# Patient Record
Sex: Female | Born: 1955 | Race: Black or African American | Hispanic: No | Marital: Married | Smoking: Never smoker
Health system: Southern US, Community
[De-identification: ages and names within clinical notes are randomized; demographics above are authoritative.]

## PROBLEM LIST (undated history)

## (undated) DIAGNOSIS — K219 Gastro-esophageal reflux disease without esophagitis: Secondary | ICD-10-CM

## (undated) DIAGNOSIS — G459 Transient cerebral ischemic attack, unspecified: Secondary | ICD-10-CM

## (undated) DIAGNOSIS — G4700 Insomnia, unspecified: Secondary | ICD-10-CM

## (undated) DIAGNOSIS — I639 Cerebral infarction, unspecified: Secondary | ICD-10-CM

## (undated) DIAGNOSIS — Z460 Encounter for fitting and adjustment of spectacles and contact lenses: Secondary | ICD-10-CM

## (undated) DIAGNOSIS — I10 Essential (primary) hypertension: Secondary | ICD-10-CM

## (undated) DIAGNOSIS — G4730 Sleep apnea, unspecified: Secondary | ICD-10-CM

## (undated) DIAGNOSIS — G8929 Other chronic pain: Secondary | ICD-10-CM

## (undated) DIAGNOSIS — E039 Hypothyroidism, unspecified: Secondary | ICD-10-CM

## (undated) DIAGNOSIS — J45909 Unspecified asthma, uncomplicated: Secondary | ICD-10-CM

## (undated) DIAGNOSIS — J302 Other seasonal allergic rhinitis: Secondary | ICD-10-CM

## (undated) DIAGNOSIS — M1990 Unspecified osteoarthritis, unspecified site: Secondary | ICD-10-CM

## (undated) DIAGNOSIS — Z972 Presence of dental prosthetic device (complete) (partial): Secondary | ICD-10-CM

## (undated) HISTORY — PX: CHOLECYSTECTOMY: SHX55

## (undated) HISTORY — PX: TONSILLECTOMY: SUR1361

## (undated) HISTORY — PX: ABDOMINAL HYSTERECTOMY: SHX81

---

## 2007-04-09 ENCOUNTER — Ambulatory Visit (HOSPITAL_BASED_OUTPATIENT_CLINIC_OR_DEPARTMENT_OTHER): Admission: RE | Admit: 2007-04-09 | Discharge: 2007-04-09 | Payer: Self-pay | Admitting: Orthopedic Surgery

## 2007-04-09 HISTORY — PX: KNEE ARTHROSCOPY: SHX127

## 2008-05-11 ENCOUNTER — Inpatient Hospital Stay (HOSPITAL_COMMUNITY): Admission: RE | Admit: 2008-05-11 | Discharge: 2008-05-14 | Payer: Self-pay | Admitting: Orthopedic Surgery

## 2008-05-11 HISTORY — PX: TOTAL KNEE ARTHROPLASTY: SHX125

## 2010-05-23 LAB — CBC
HCT: 30.8 % — ABNORMAL LOW (ref 36.0–46.0)
Hemoglobin: 10.6 g/dL — ABNORMAL LOW (ref 12.0–15.0)
Hemoglobin: 11.1 g/dL — ABNORMAL LOW (ref 12.0–15.0)
Hemoglobin: 11.1 g/dL — ABNORMAL LOW (ref 12.0–15.0)
MCHC: 34.4 g/dL (ref 30.0–36.0)
Platelets: 153 10*3/uL (ref 150–400)
RBC: 3.7 MIL/uL — ABNORMAL LOW (ref 3.87–5.11)
RBC: 3.85 MIL/uL — ABNORMAL LOW (ref 3.87–5.11)
RBC: 3.88 MIL/uL (ref 3.87–5.11)
WBC: 7.4 10*3/uL (ref 4.0–10.5)
WBC: 7.5 10*3/uL (ref 4.0–10.5)

## 2010-05-23 LAB — BASIC METABOLIC PANEL
BUN: 6 mg/dL (ref 6–23)
CO2: 32 mEq/L (ref 19–32)
Calcium: 8.5 mg/dL (ref 8.4–10.5)
Calcium: 9.1 mg/dL (ref 8.4–10.5)
Creatinine, Ser: 0.77 mg/dL (ref 0.4–1.2)
GFR calc Af Amer: >60 mL/min (ref >60)
GFR calc Af Amer: >60 mL/min (ref >60)
GFR calc Af Amer: >60 mL/min (ref >60)
GFR calc non Af Amer: >60 mL/min (ref >60)
GFR calc non Af Amer: >60 mL/min (ref >60)
Potassium: 4 mEq/L (ref 3.5–5.1)
Potassium: 4 mEq/L (ref 3.5–5.1)
Sodium: 132 mEq/L — ABNORMAL LOW (ref 135–145)
Sodium: 141 mEq/L (ref 135–145)
Sodium: 143 mEq/L (ref 135–145)

## 2010-05-23 LAB — PROTIME-INR
INR: 1.1 (ref 0.00–1.49)
INR: 1.3 (ref 0.00–1.49)
Prothrombin Time: 14.9 seconds (ref 11.6–15.2)

## 2010-05-24 LAB — COMPREHENSIVE METABOLIC PANEL
Alkaline Phosphatase: 67 U/L (ref 39–117)
BUN: 12 mg/dL (ref 6–23)
Chloride: 100 mEq/L (ref 96–112)
Glucose, Bld: 93 mg/dL (ref 70–99)
Potassium: 3.6 mEq/L (ref 3.5–5.1)
Total Bilirubin: 0.4 mg/dL (ref 0.3–1.2)

## 2010-05-24 LAB — CBC
HCT: 41 % (ref 36.0–46.0)
Hemoglobin: 13.8 g/dL (ref 12.0–15.0)
WBC: 7.4 10*3/uL (ref 4.0–10.5)

## 2010-05-24 LAB — PROTIME-INR
INR: 1 (ref 0.00–1.49)
Prothrombin Time: 12.8 seconds (ref 11.6–15.2)

## 2010-05-24 LAB — URINALYSIS, ROUTINE W REFLEX MICROSCOPIC
Bilirubin Urine: NEGATIVE
Hgb urine dipstick: NEGATIVE
Protein, ur: NEGATIVE mg/dL
Urobilinogen, UA: 0.2 mg/dL (ref 0.0–1.0)

## 2010-05-24 LAB — TYPE AND SCREEN: Antibody Screen: NEGATIVE

## 2010-06-03 IMAGING — CR DG KNEE 1-2V PORT*L*
2 series · 2 of 2 positions shown · non-contrast
Comparison: None available.

CLINICAL DATA: Knee replacement.

PORTABLE LEFT KNEE - 1-2 VIEW

[view not recorded (1 of 2)]
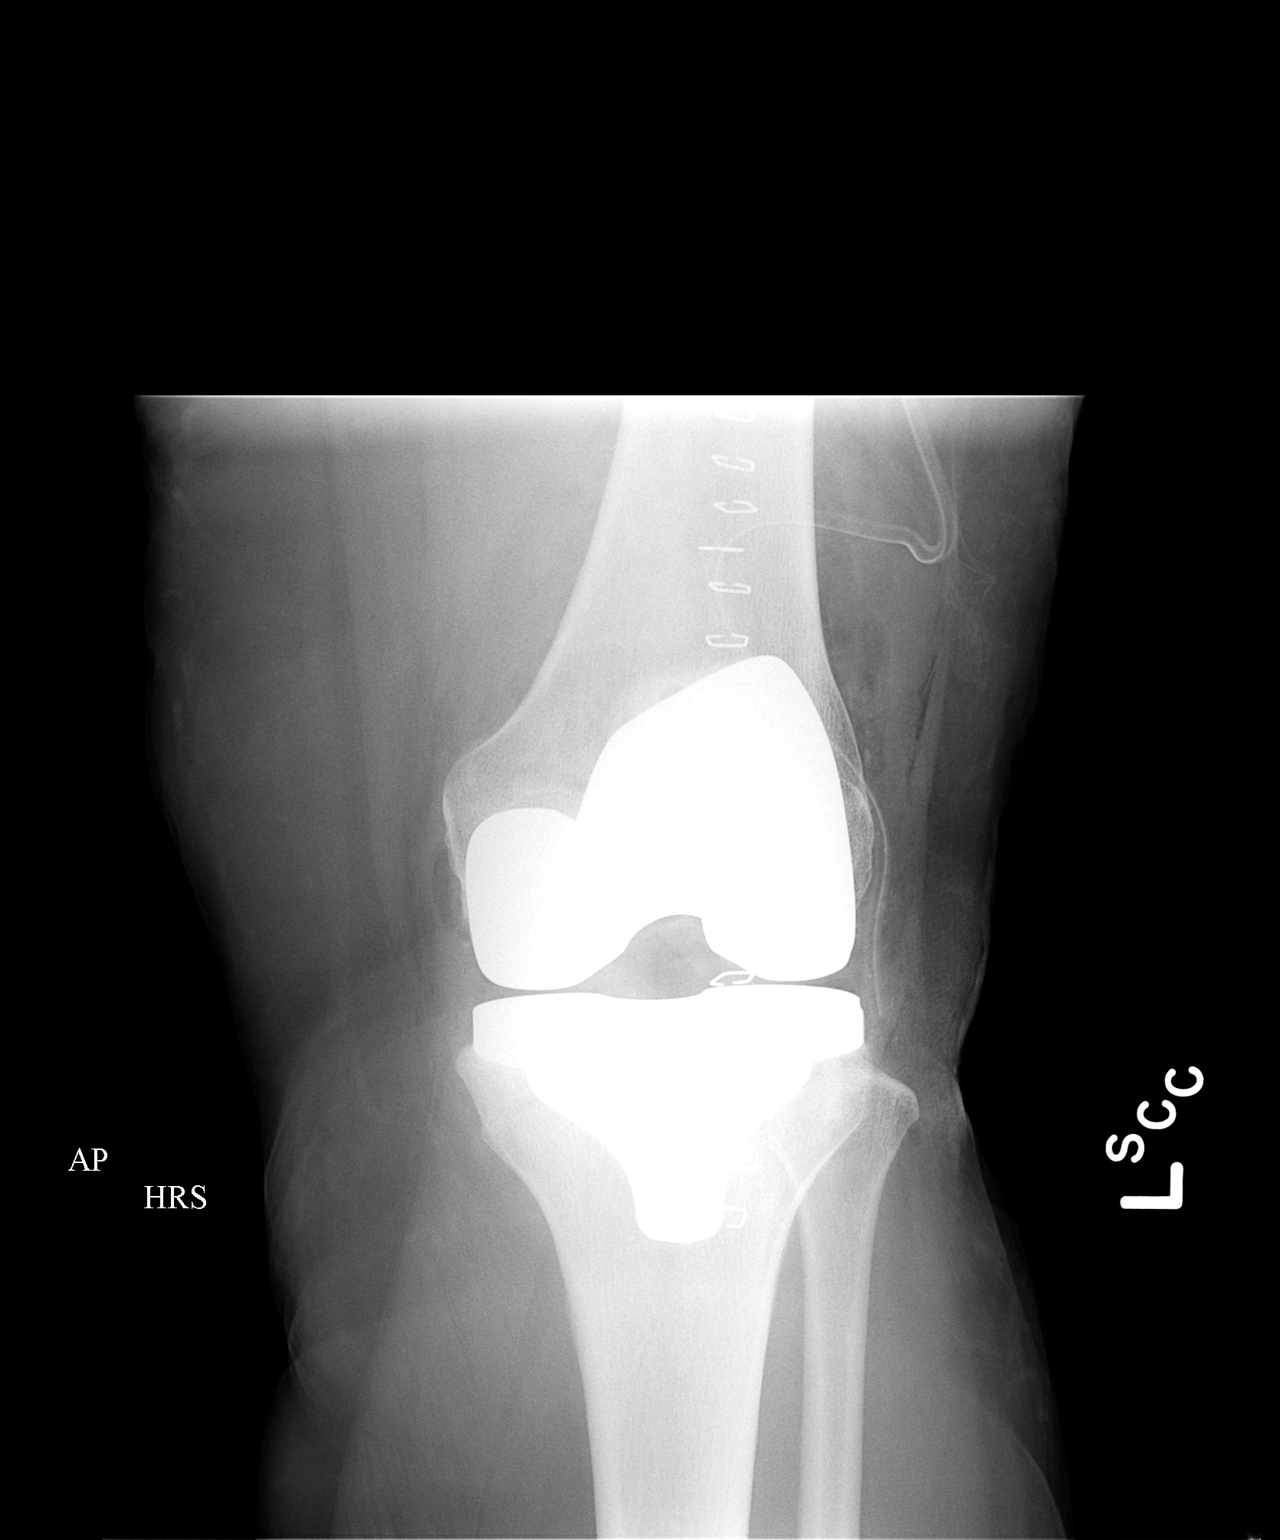

[view not recorded (2 of 2)]
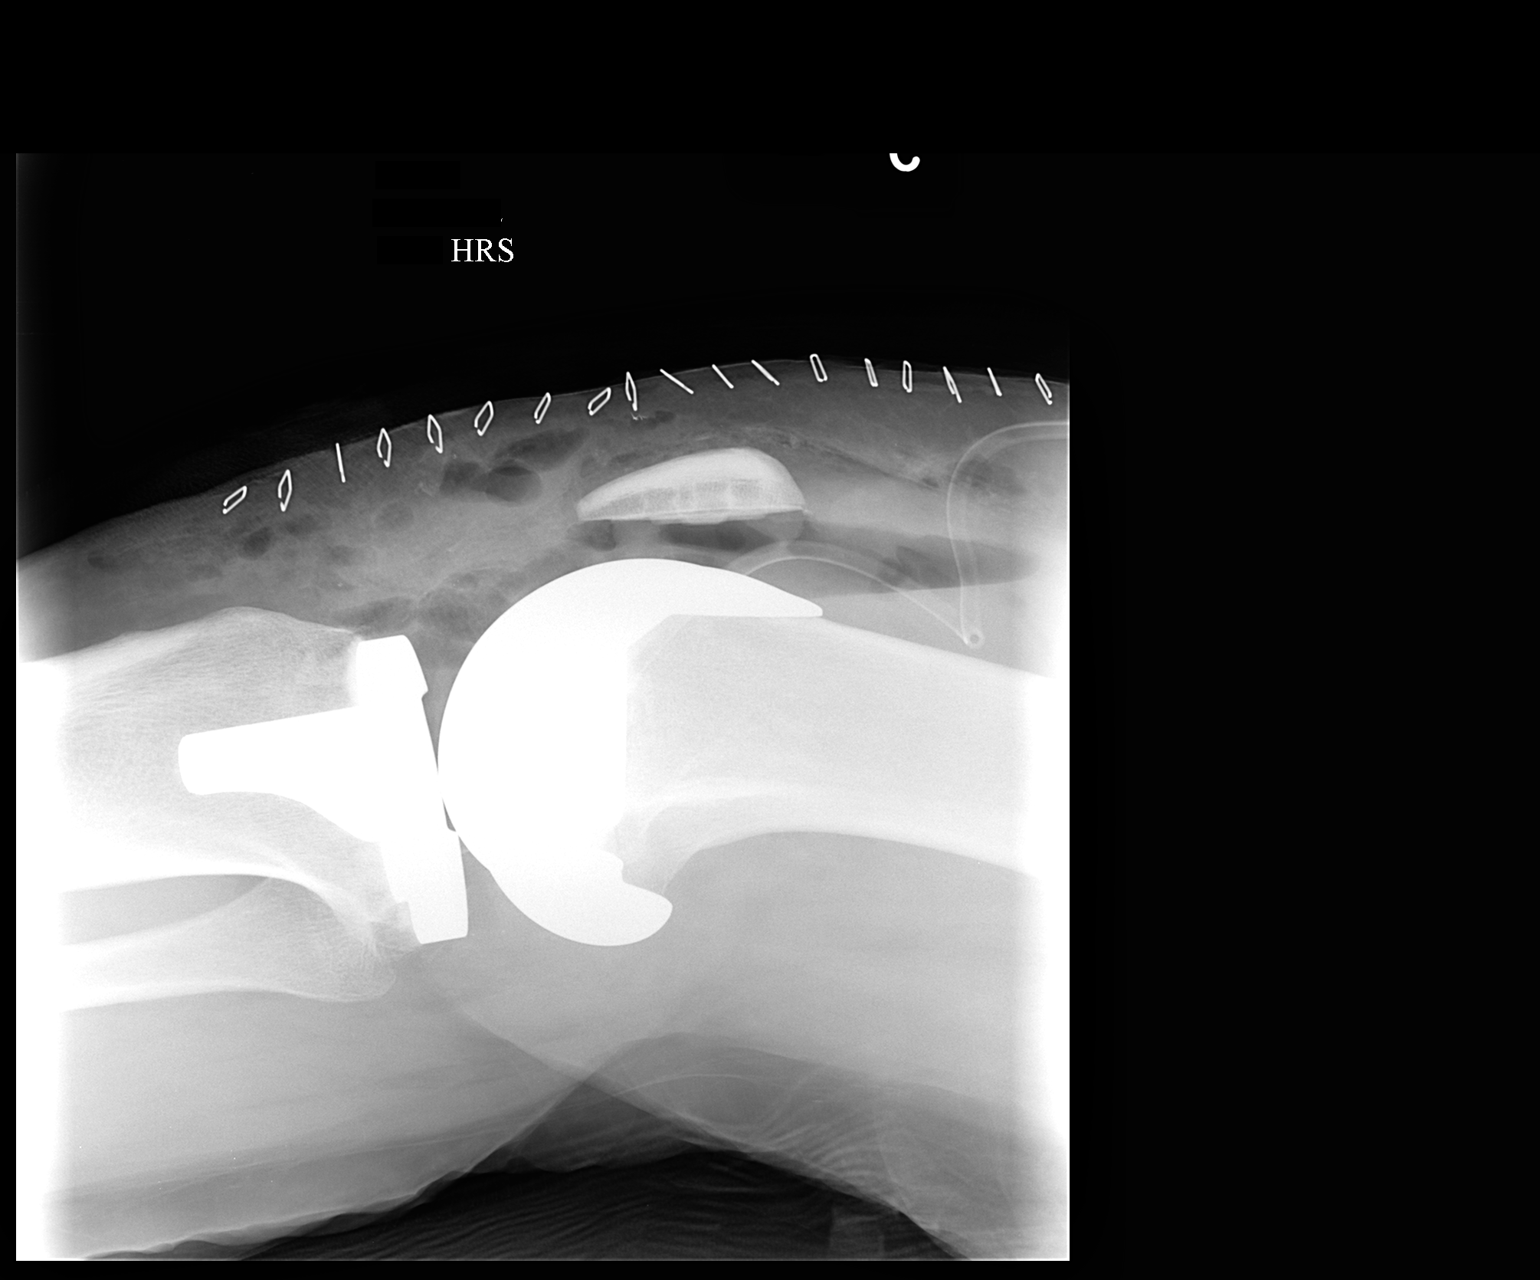

[2 of 2 positions shown; findings below may reference images not displayed]

FINDINGS: The patient has a left total knee arthroplasty.  Surgical
staples and drain are noted.  There is gas in the joint with a
joint effusion.  No fracture is present and the device is located.
IMPRESSION: Left total knee replaced without evidence of complication.

## 2010-06-26 NOTE — Op Note (Signed)
Mary Harvey, Mary Harvey              ACCOUNT NO.:  [id number]   MEDICAL RECORD NO.:  [id number]          PATIENT TYPE:  INP   LOCATION:  5007                         FACILITY:  [facility]   PHYSICIAN:  Loreta Ave, M.D. DATE OF BIRTH:  [birth year]   DATE OF PROCEDURE:  05/11/2008  DATE OF DISCHARGE:                               OPERATIVE REPORT   PREOPERATIVE DIAGNOSIS:  End-stage degenerative arthritis, left knee,  varus alignment.   POSTOPERATIVE DIAGNOSIS:  End-stage degenerative arthritis, left knee,  varus alignment.   PROCEDURE:  Left total knee replacement.  Modified minimally invasive  approach.  Soft tissue balancing.  Cemented pegged posterior stabilized  Triathlon Stryker femoral component.  Cemented #4 tibial component with  11-mm polyethylene insert.  Resurfacing patella.  cemented and pegged  medial offset 32-mm component.   SURGEON:  Loreta Ave, MD   ASSISTANT:  Genene Churn. Barry Dienes, [place], present throughout the entire case and  necessary for timely completion of procedure.   ANESTHESIA:  General.   BLOOD LOSS:  Minimal.   SPECIMEN:  None.   CULTURES:  None.   COMPLICATIONS:  None.   DRESSINGS:  Soft compressive knee immobilizer.   DRAINS:  Hemovac x1.   TOURNIQUET TIME:  1 hour 15 minutes.   PROCEDURE:  The patient was brought to the operating room and placed on  operating table in supine position.  After an adequate anesthesia had  been obtained, knee examined.  Full extension, good flexion, varus  alignment correctable to neutral.  Tourniquet applied.  Prepped and  draped in usual sterile fashion.  Exsanguinated with elevation and  Esmarch.  Tourniquet inflated to 350 mmHg.  Straight incision above the  patella down to tibial tubercle.  Medial arthrotomy, vastus splitting,  preserving quad tendon for a modified minimally invasive approach.  Knee  exposed.  Remnants of menisci, cruciate ligaments, loose body, and spurs  removed.  Grade 4 changes most  marked medially.  Distal femoral  resection.  Intramedullary guide set at 5 degrees of valgus resecting 10  mm.  Using epicondylar axis, sized, cut, and fitted for #4 component.  Tibia exposed.  Extramedullary guide.  Posterior slope cut 3 degrees  going below the defect medially.  Sized to #4 component.  All recess  examined.  All debris removed.  Patella exposed.  Measured, cut,  drilled, and fitted for 32-mm component.  Trial put in place throughout  #4 above and #4 below.  With 11-mm insert and 32-mm patella, I had full  extension, full flexion, good alignment, good stability, and good  patellofemoral tracking.  Tibia was marked for rotation and hand reamed.  All trials removed.  Copious irrigation with a pulse irrigating device.  Cement prepared, placed on all components, and firmly seated.  Excess of  cement removed.  Polyethylene attached to tibia.  Knee reduced.  Patellar clamp put in place.  Once cement hardened, it was reexamined.  Excellent stability, alignment, flexion/extension, and tracking.  Hemovac placed and brought out through a separate stab wound.  Knee once  again irrigated.  Arthrotomy closed with #1 Vicryl.  Skin  and  subcutaneous tissue with Vicryl and staples.  Knee injected with  Marcaine.  Hemovac clamped.  Sterile compressive dressing applied.  Tourniquet deflated and removed.  Knee immobilizer applied.  Anesthesia  reversed.  Brought to the room.  Tolerated the surgery well.  No  complications.      Loreta Ave, M.D.  Electronically Signed     DFM/MEDQ  D:  05/11/2008  T:  05/12/2008  Job:  [id number]

## 2010-06-26 NOTE — Op Note (Signed)
Mary Harvey, Mary Harvey              ACCOUNT NO.:  [id number]   MEDICAL RECORD NO.:  [id number]          PATIENT TYPE:  AMB   LOCATION:  DSC                          FACILITY:  [facility]   PHYSICIAN:  Loreta Ave, M.D. DATE OF BIRTH:  December 20, 1955   DATE OF PROCEDURE:  04/09/2007  DATE OF DISCHARGE:                               OPERATIVE REPORT   PREOPERATIVE DIAGNOSES:  1. Left knee chondromalacia of patella.  2. Medial meniscus tear.   POSTOPERATIVE DIAGNOSES:  1. 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  2. Extensive tear in posterior half of the medial meniscus with flap      tears in the front and back of the lateral meniscus.   OPERATION PERFORMED:  1. Left knee examination under anesthesia, arthroscopy.  2. Debridement of the medial and lateral meniscus.  3. Chondroplasty of the patella and medial femoral condyle.  4. Removal of chondral loose bodies and hypertrophic synovitis.   SURGEON:  Loreta Ave, M.D.   ASSISTANT:  Genene Churn. Owens, P. A.   ANESTHESIA:  General.   ESTIMATED BLOOD LOSS:  The blood loss was minimal.   TOURNIQUET TIME:  A tourniquet was not employed.   SPECIMENS:  None.   CULTURES:  None.   COMPLICATIONS:  None.   DRESSINGS:  Soft compressive.   DESCRIPTION OF THE OPERATION:  The patient was brought to the operating  room and was placed on the operating table in the supine position.  After adequate anesthesia had been obtained the left knee was examined.  The patella alta had some lateral tracking, but no tethering.  There  were stable ligaments.  Tourniquet leg holder was applied.  The leg was  prepped and draped in the usual sterile fashion.  Three portals were  created; one superolateral and one each medially and laterally in the  parapatellar.  The inflow catheter was introduced.  The knee was  distended.  The arthroscope was introduced.   The knee was inspected.  Extensive grade 3 changes on the  patella were  debrided.  The patella alta had lateral tracking, but no tethering.  The  trochlea looked good.  There were chondral loose bodies throughout and  hypertrophic synovitis throughout, which were all debrided.  The ACL had  some myxoid degeneration, but was still intact and functional.  The  medial compartment showed complete detachment of posterior and medial  meniscus, and the entire posterior half unstable and displaced out.  The  posterior half was removed, tapered the remaining meniscus.  The tibia  looked good, but on the femur about halfway __________  met a deep grade  3 lesion.  Chondroplasty was done to a stable surface throughout.  All  loose fragments were removed.  Laterally there were five tears in the  front and back of the lateral meniscus, and some small radial tears in  the middle.  These were saucerized out and tapered in smoothly.  The  lateral component otherwise looked good.   The entire knee was examined and no other findings were  appreciated.   The instruments and fluid were removed.  The portals in the knee were  injected with Marcaine.  The portals were closed with 4-0 nylon.  A  sterile compressive dressing was applied.   Anesthesia was reversed.  The patient was taken to the recovery room.  The patient tolerated surgery well with no complications.      Loreta Ave, M.D.  Electronically Signed     DFM/MEDQ  D:  04/09/2007  T:  04/10/2007  Job:  [id number]

## 2010-06-29 NOTE — Discharge Summary (Signed)
NAMERUTHY, Mary Harvey              ACCOUNT NO.:  [id number]   MEDICAL RECORD NO.:  [id number]          PATIENT TYPE:  INP   LOCATION:  5007                         FACILITY:  [facility]   PHYSICIAN:  Loreta Ave, M.D. DATE OF BIRTH:  07-15-1955   DATE OF ADMISSION:  05/11/2008  DATE OF DISCHARGE:  05/14/2008                               DISCHARGE SUMMARY   FINAL DIAGNOSES:  1. Status post left total knee replacement for end-stage degenerative      joint disease.  2. Hypertension.  3. Asthma.   HISTORY OF PRESENT ILLNESS:  A 55 year old black female with history of  end-stage DJD and left knee chronic pain, presented to our office for  preop evaluation for a total knee replacement.  She had progressively  worsening pain with failure to response with conservative treatment.  Significant decrease in her daily activities due to the ongoing  complaint.   HOSPITAL COURSE:  On May 11, 2008, the patient was taken to the [facility] OR, and a left total knee replacement procedure performed.   SURGEON:  Loreta Ave, MD   ASSISTANT:  Genene Churn. Barry Dienes, PA-C   ANESTHESIA:  General.   SPECIMENS:  None.   ESTIMATED BLOOD LOSS:  Minimal.   TOURNIQUET TIME:  90 minutes.   DRAINS:  One Hemovac drain placed.   COMPLICATIONS:  There were no surgical or anesthesia complications, and  the patient was transferred to recovery in stable condition.   On May 12, 2008, the patient complained of nausea.  Pain control.  Vital signs stable, afebrile.  Hemoglobin 11.1.  INR 1.1.  Discontinued  morphine PCA.  Percocet changed to Norco.  Discontinued Foley.  PT/OT  consults.  Pharmacy protocol Coumadin started.  On May 13, 2008, the  patient doing well.  She is progressing with therapy.  Vital signs are  stable, afebrile.  Wound looks good and staples intact.  There are no  drainage or signs of infection.  Nausea improved.  May 14, 2008, the  patient doing well with good pain control.  No  bowel movement yet.  Vital signs stable, afebrile.  Hemoglobin 11.1.  INR 1.3.  Electrolytes  stable.  Wound looks good.  Minimal serous drainage from drain portal.  No infection.  Calf nontender.  Neurovascularly intact.  She has  progressed very well with therapy.  She is stable to discharge home  after she has a bowel movement.   CONDITION:  Good and stable.   DISPOSITION:  Discharge home.   MEDICATIONS:  1. Norco 7.5/325 one or two tabs p.o. q.4-6 h. p.r.n. for pain.  2. Robaxin 500 mg 1 tablet p.o. q.6 h. p.r.n. for spasms.  3. Coumadin pharmacy protocol.  4. Resume previous home meds.   INSTRUCTIONS:  The patient will work with home health PT and OT to  improve ambulation and knee range of motion and strengthening.  Daily  dressing changes with 4x4 gauze and tape.  She will follow up in office  when she is 2 weeks postop for recheck.  Return sooner if needed.  Genene Churn. Denton Meek.      Loreta Ave, M.D.  Electronically Signed    JMO/MEDQ  D:  06/22/2008  T:  06/22/2008  Job:  [id number]

## 2010-11-02 LAB — POCT I-STAT, CHEM 8
BUN: 12
Calcium, Ion: 1.06 — ABNORMAL LOW
Chloride: 100
Creatinine, Ser: 0.8
Glucose, Bld: 94
HCT: 44
Hemoglobin: 15
Potassium: 3.3 — ABNORMAL LOW
Sodium: 136
TCO2: 29

## 2011-05-07 ENCOUNTER — Other Ambulatory Visit: Payer: Self-pay | Admitting: Orthopedic Surgery

## 2011-05-07 DIAGNOSIS — R937 Abnormal findings on diagnostic imaging of other parts of musculoskeletal system: Secondary | ICD-10-CM

## 2011-05-07 DIAGNOSIS — M519 Unspecified thoracic, thoracolumbar and lumbosacral intervertebral disc disorder: Secondary | ICD-10-CM

## 2011-05-09 ENCOUNTER — Other Ambulatory Visit: Payer: Self-pay

## 2011-05-10 ENCOUNTER — Ambulatory Visit
Admission: RE | Admit: 2011-05-10 | Discharge: 2011-05-10 | Disposition: A | Discharge status: Home / Self Care | Payer: BC Managed Care – PPO | Source: Ambulatory Visit | Attending: Orthopedic Surgery | Admitting: Orthopedic Surgery

## 2011-05-10 DIAGNOSIS — M519 Unspecified thoracic, thoracolumbar and lumbosacral intervertebral disc disorder: Secondary | ICD-10-CM

## 2011-05-10 DIAGNOSIS — R937 Abnormal findings on diagnostic imaging of other parts of musculoskeletal system: Secondary | ICD-10-CM

## 2011-07-22 ENCOUNTER — Encounter (HOSPITAL_BASED_OUTPATIENT_CLINIC_OR_DEPARTMENT_OTHER): Payer: Self-pay | Admitting: *Deleted

## 2011-07-23 ENCOUNTER — Encounter (HOSPITAL_BASED_OUTPATIENT_CLINIC_OR_DEPARTMENT_OTHER): Payer: Self-pay | Admitting: *Deleted

## 2011-07-23 NOTE — Progress Notes (Signed)
Have cardiac notes and ekg Bring all meds,overnight bag, Will need [address]

## 2011-07-24 NOTE — H&P (Signed)
[facility]   A Division of [facility]   [address] [place], [place] [address] Telephone: [phone]  Fax: [phone]     PATIENT: Mary Harvey, Mary Harvey   MR#: [id number]  Visit Date: 05/21/2011     HISTORY OF PRESENT ILLNESS:  The patient is referred by Dr. Eulah Pont and Zonia Kief, PA-C for evaluation of neck and low back pain.  She has had ongoing symptoms since 2005.  She had problems with her knees.  She had total knee arthroplasty.  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     The patient states that she has pain that wakes in the morning.  She has more pain going down her left arm than right.  It radiates down to her fingers primarily along the radial aspect.  She had a MRI scan which showed spondylitic changes most prominent on the right at C5-6 corresponding with her symptoms, less significant changes at C4-5 and then C6-7 even less remarkable.  There is tiny disk protrusions at C2-3 and C3-4 as well, but they are not causing any compression.  She has had some injections in her shoulders, has had bone-on-bone changes on 1 shoulder, has bursitis in the other shoulder.  The lumbar spine continues to give her problems not as severe as the neck.  The pain radiates from her back down into her leg and L5-S1 shows posterior disk bulge with protrusion to the right of midline causing narrowing of the right neural foramina.  This MRI was in July of 2012.  Cervical MRI was recent 05/03/2011.  The patient is having her thyroid cyst monitored with yearly ultrasound.    CURRENT MEDICATIONS:  She has hypertension medicines, thyroid medication which she has been on for 1-1/2 years.   PAST MEDICAL/SURGICAL HISTORY:  She has had a previous left total knee arthroplasty by Dr. Eulah Pont in 2010.     SOCIAL HISTORY:  The patient is married to her husband, Mary Harvey.  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  She has to do a lot of lifting, standing.  She has a very active job.     FAMILY HISTORY:  Positive for diabetes, heart disease, hypertension.   REVIEW OF SYSTEMS:  Positive for hypertension and thyroid condition.   PHYSICAL EXAMINATION:  She is alert and oriented.  She has bilateral brachial plexus tenderness.  She has some discomfort with cervical flexion.  She can flex chin to chest.     She has intact upper extremity reflexes.  Positive impingement on the right.  Left shoulder she has pain with internal/external rotation.  Limited range of motion with abduction with crepitus.   She also has some significant tenderness of trochanteric bursa and has some secondary trochanteric bursitis. There is sciatic notch tenderness.  Negative straight leg raise to 90 degrees.  EHL, anterior tib is strong.  MRI scan shows some disk protrusion at the 5-1 level.   RADIOGRAPHS/TEST:  C-spine MRI scan is reviewed with the patient.   PLAN:  We will set her up for some physical therapy for the cervical spine.  Check her back in 6 weeks.  I discussed with her in detail, she has a couple of problems that are related, some of the cervical spine giving her problems at C4-5 and C5-6.  These appear to be the worst levels.  We discussed operative intervention for these levels.  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  She understands that some of her shoulder pain caused by shoulder pathology would not be improved after she had a cervical procedure to take care of the disk problems that she has.  Hopefully she can get some improvement with physical therapy.  I recommend checking a BMET and uric acid; her symptoms are not classic for gout, certainly would be unusual, however, with significant osteoarthritis of her left shoulder, also the knee problems, she might have some crystal arthropathy unrecognized.  We will  call her with the results and I will check her back in 6-7 weeks after some therapy at [facility].   For additional information please see handwritten notes, reports, orders and prescriptions in this chart.      Mark C. Ophelia Charter, M.D.    Auto-Authenticated by Veverly Fells. Ophelia Charter, M.D.  MCY/cd/af DD: 05/21/2011  DT: 05/22/2011   cc: Loreta Ave, MD(Emdat [employer])   Genene Churn. Barry Dienes, [facility]    [facility]   A Division of [facility]   [address], [place], [place] [address] Telephone: [phone]  Fax: [phone]     PATIENT: Mary Harvey, Mary Harvey   MR#: [id number]  Visit Date: 07/02/2011     The patient returns.  She states she is having persistent problems with neck pain, shoulder pain, pain that radiates down her arm.  It is worse on the left arm than the right arm.  Her symptoms have been persistent.  She has been through physical therapy for 4-6 weeks with persistent symptoms.     No change in her hypertension, thyroid medications.  She had a left total knee arthroplasty.  She has known osteoarthritis of both shoulders.  No change in family history, social history, these all updated from 05/21/2011 and are unchanged.   PHYSICAL EXAMINATION:  The patient is 5 feet 4 inches, 195 pounds.  Alert and oriented, WD, WN.  No rash over exposed skin.  No supraclavicular lymphadenopathy.  Bilateral brachial plexus tenderness worse on the left than right.  Positive Spurling on the left, negative Lhermitte.  Shoulder range of motion shows full range of motion with discomfort.  She has pain with extreme range of motion of her left shoulder.  Positive impingement on the left.  Upper extremity reflexes are 1+ and symmetrical.  Long head of the biceps is tender.  Impingement positive on the left.  She had a previous MRI of her shoulder which was done in [place], [place].     The patient has normal gait, has persistent tenderness lumbar spine.  EHL, anterior  tib is intact.  No distal edema and she has intact pedal pulses.  No venous stasis changes.   ASSESSMENT:  Cervical spondylosis most significant C4-5, C5-6.    PLAN:  We will set her up for 1 epidural cervical injection.  She has some osteoarthritis of her shoulder with some impingement and we will see her back after the epidural.  Robaxin was renewed, this seems to be helping her, she will call if she needs a refill later.  Office followup after epidural then we can make a decision about cervical spine 2-level fusion.   For additional information please see handwritten notes, reports, orders and prescriptions in this chart.      Mark C. Ophelia Charter, M.D.    Auto-Authenticated by Veverly Fells. Ophelia Charter, M.D.  MCY/cd DD: 07/02/2011  DT: 07/03/2011   [phone]  [phone] A Division of [facility]  Loreta Ave, M.D.     Robert A. Thurston Hole, M.D.     Lunette Stands, M.D. Eulas Post, M.D.    Buford Dresser, M.D. Estell Harpin, M.D. Ralene Cork, D.O.          Genene Churn. Barry Dienes, PA-C            Kirstin A. Shepperson, PA-C Archie, OPA-C   RE: Jamylah, Marinaccio                                [phone]      DOB: 1956-02-05 PROGRESS NOTE: 07-16-11 Mary Harvey comes in for follow up.  Biggest issue is her left shoulder.  Well documented arthritis.  Getting worse rather than better.  We have exhausted conservative therapy.  X-rays showing extreme degenerative changes approaching bone on bone.  MRI scan of her shoulder done on April 10, 2010 showed considerable severe degenerative changes throughout the shoulder.  An un-united os acromiale Type III.  Some mild impingement.  Rotator cuff intact.  Injection and treatment both subacromial and intraarticular, only transiently helpful, nothing dramatic in the way of improvement.  Recent workup of cervical spine and conservative treatment has been helpful to an extent and I wanted to be sure that wasn't causing an overlay of symptoms down the  right shoulder.  That treatment and evaluation completed by Dr. Ophelia Charter.  Although some improvement with that, she now really has given this a lot of thought and it is the shoulder that is her biggest problem.  Rest pain and night pain.  Marked functional impact.  Losing motion.  Wakes her up at night.  Still has some soreness in the cervical spine.  Occasional numbness and tingling down into her hand, but the global deep seated left shoulder pain is the biggest issue.  Workup and treatment to date reviewed.  All previous tests and treatment by Dr. Ophelia Charter reviewed and discussed with her.    EXAMINATION: General exam is outlined and included in the chart.  Specifically today, any motion of her left shoulder is painful.  A little bit of global atrophy, nothing extreme.  She has lost a good 30% of both active and passive motion.  Extremely painful at end points.  It does feel like her cuff is intact.    X-RAYS: A new three view x-ray of the shoulder shows that she is bone on bone glenohumeral joint with periarticular spurs.    DISPOSITION:  At this point in time the vast majority of her symptoms are coming from the arthritis in her shoulder.  The only viable option we have is total shoulder replacement.  She is 56 years old and we have been trying to put this off as long as possible.  We have finally reached a point where that is really the only option we have and she would like to proceed.  Of note, I have already done a left total knee replacement on her on May 11, 2008 which continues to do well.    Left total shoulder replacement, procedures, risks, benefits and complications reviewed in detail.  All paperwork complete.  All questions answered.  We will see her at the time of operative intervention.    Loreta Ave, M.D.   Electronically verified by Loreta Ave, M.D. DFM:jjh D 07-17-11

## 2011-07-25 ENCOUNTER — Encounter (HOSPITAL_BASED_OUTPATIENT_CLINIC_OR_DEPARTMENT_OTHER): Payer: Self-pay | Admitting: Anesthesiology

## 2011-07-25 ENCOUNTER — Ambulatory Visit (HOSPITAL_BASED_OUTPATIENT_CLINIC_OR_DEPARTMENT_OTHER): Payer: BC Managed Care – PPO | Admitting: Anesthesiology

## 2011-07-25 ENCOUNTER — Encounter (HOSPITAL_BASED_OUTPATIENT_CLINIC_OR_DEPARTMENT_OTHER)
Admission: RE | Disposition: A | Discharge status: Home / Self Care | Payer: Self-pay | Source: Ambulatory Visit | Attending: Orthopedic Surgery

## 2011-07-25 ENCOUNTER — Encounter (HOSPITAL_BASED_OUTPATIENT_CLINIC_OR_DEPARTMENT_OTHER): Payer: Self-pay | Admitting: *Deleted

## 2011-07-25 ENCOUNTER — Ambulatory Visit (HOSPITAL_BASED_OUTPATIENT_CLINIC_OR_DEPARTMENT_OTHER)
Admission: RE | Admit: 2011-07-25 | Discharge: 2011-07-26 | Disposition: A | Discharge status: Home / Self Care | Payer: BC Managed Care – PPO | Source: Ambulatory Visit | Attending: Orthopedic Surgery | Admitting: Orthopedic Surgery

## 2011-07-25 DIAGNOSIS — Z4789 Encounter for other orthopedic aftercare: Secondary | ICD-10-CM

## 2011-07-25 DIAGNOSIS — I10 Essential (primary) hypertension: Secondary | ICD-10-CM | POA: Insufficient documentation

## 2011-07-25 DIAGNOSIS — M19019 Primary osteoarthritis, unspecified shoulder: Secondary | ICD-10-CM | POA: Insufficient documentation

## 2011-07-25 HISTORY — DX: Other seasonal allergic rhinitis: J30.2

## 2011-07-25 HISTORY — DX: Other chronic pain: G89.29

## 2011-07-25 HISTORY — DX: Unspecified osteoarthritis, unspecified site: M19.90

## 2011-07-25 HISTORY — DX: Insomnia, unspecified: G47.00

## 2011-07-25 HISTORY — PX: TOTAL SHOULDER ARTHROPLASTY: SHX126

## 2011-07-25 HISTORY — DX: Essential (primary) hypertension: I10

## 2011-07-25 HISTORY — DX: Hypothyroidism, unspecified: E03.9

## 2011-07-25 LAB — POCT I-STAT, CHEM 8
Calcium, Ion: 1.27 mmol/L (ref 1.12–1.32)
Chloride: 105 mEq/L (ref 96–112)
Glucose, Bld: 94 mg/dL (ref 70–99)
HCT: 44 % (ref 36.0–46.0)
Hemoglobin: 15 g/dL (ref 12.0–15.0)
TCO2: 27 mmol/L (ref 0–100)

## 2011-07-25 SURGERY — ARTHROPLASTY, SHOULDER, TOTAL
Anesthesia: General | Site: Shoulder | Laterality: Left | Wound class: Clean

## 2011-07-25 MED ORDER — BUPIVACAINE HCL (PF) 0.5 % IJ SOLN
INTRAMUSCULAR | Status: DC | PRN
  Administered 2011-07-25: 20 mL

## 2011-07-25 MED ORDER — CEFAZOLIN SODIUM 1-5 GM-% IV SOLN
1.0000 g | Freq: Three times a day (TID) | INTRAVENOUS | Status: AC
  Administered 2011-07-25 – 2011-07-26 (×2): 1 g via INTRAVENOUS

## 2011-07-25 MED ORDER — HYDROMORPHONE HCL PF 1 MG/ML IJ SOLN: 0.5000 mg | INTRAMUSCULAR | Status: DC | PRN

## 2011-07-25 MED ORDER — METHOCARBAMOL 100 MG/ML IJ SOLN: 500.0000 mg | Freq: Four times a day (QID) | INTRAMUSCULAR | Status: DC | PRN

## 2011-07-25 MED ORDER — HYDRALAZINE HCL 25 MG PO TABS
25.0000 mg | ORAL_TABLET | Freq: Two times a day (BID) | ORAL | Status: DC
  Administered 2011-07-25: 25 mg via ORAL

## 2011-07-25 MED ORDER — SUCCINYLCHOLINE CHLORIDE 20 MG/ML IJ SOLN
INTRAMUSCULAR | Status: DC | PRN
  Administered 2011-07-25: 100 mg via INTRAVENOUS

## 2011-07-25 MED ORDER — PHENYLEPHRINE HCL 10 MG/ML IJ SOLN
10.0000 mg | INTRAVENOUS | Status: DC | PRN
  Administered 2011-07-25: 50 ug/min via INTRAVENOUS

## 2011-07-25 MED ORDER — EPHEDRINE SULFATE 50 MG/ML IJ SOLN
INTRAMUSCULAR | Status: DC | PRN
  Administered 2011-07-25: 15 mg via INTRAVENOUS

## 2011-07-25 MED ORDER — OXYCODONE-ACETAMINOPHEN 5-325 MG PO TABS
1.0000 | ORAL_TABLET | ORAL | Status: DC | PRN
  Administered 2011-07-25 – 2011-07-26 (×2): 2 via ORAL

## 2011-07-25 MED ORDER — LORATADINE 10 MG PO TABS: 10.0000 mg | ORAL_TABLET | Freq: Every day | ORAL | Status: DC

## 2011-07-25 MED ORDER — PROPOFOL 10 MG/ML IV EMUL
INTRAVENOUS | Status: DC | PRN
  Administered 2011-07-25: 50 mg via INTRAVENOUS
  Administered 2011-07-25: 250 mg via INTRAVENOUS

## 2011-07-25 MED ORDER — METOCLOPRAMIDE HCL 5 MG PO TABS: 5.0000 mg | ORAL_TABLET | Freq: Three times a day (TID) | ORAL | Status: DC | PRN

## 2011-07-25 MED ORDER — FENTANYL CITRATE 0.05 MG/ML IJ SOLN
INTRAMUSCULAR | Status: DC | PRN
  Administered 2011-07-25: 25 ug via INTRAVENOUS

## 2011-07-25 MED ORDER — MIDAZOLAM HCL 2 MG/2ML IJ SOLN
0.5000 mg | INTRAMUSCULAR | Status: DC | PRN
Start: 2011-07-25 — End: 2011-07-25
  Administered 2011-07-25: 2 mg via INTRAVENOUS

## 2011-07-25 MED ORDER — DEXAMETHASONE SODIUM PHOSPHATE 4 MG/ML IJ SOLN
INTRAMUSCULAR | Status: DC | PRN
  Administered 2011-07-25: 10 mg via INTRAVENOUS

## 2011-07-25 MED ORDER — DOCUSATE SODIUM 100 MG PO CAPS
100.0000 mg | ORAL_CAPSULE | Freq: Two times a day (BID) | ORAL | Status: DC
  Administered 2011-07-25: 100 mg via ORAL

## 2011-07-25 MED ORDER — ONDANSETRON HCL 4 MG/2ML IJ SOLN
INTRAMUSCULAR | Status: DC | PRN
  Administered 2011-07-25: 4 mg via INTRAVENOUS

## 2011-07-25 MED ORDER — ONDANSETRON HCL 4 MG/2ML IJ SOLN: 4.0000 mg | Freq: Four times a day (QID) | INTRAMUSCULAR | Status: DC | PRN

## 2011-07-25 MED ORDER — ONDANSETRON HCL 4 MG PO TABS: 4.0000 mg | ORAL_TABLET | Freq: Four times a day (QID) | ORAL | Status: DC | PRN

## 2011-07-25 MED ORDER — METOCLOPRAMIDE HCL 5 MG/ML IJ SOLN: 10.0000 mg | Freq: Once | INTRAMUSCULAR | Status: AC | PRN

## 2011-07-25 MED ORDER — POTASSIUM CHLORIDE IN NACL 20-0.9 MEQ/L-% IV SOLN
INTRAVENOUS | Status: DC
  Administered 2011-07-25: 16:00:00 via INTRAVENOUS

## 2011-07-25 MED ORDER — METHOCARBAMOL 500 MG PO TABS: 500.0000 mg | ORAL_TABLET | Freq: Four times a day (QID) | ORAL | Status: DC | PRN

## 2011-07-25 MED ORDER — LIDOCAINE HCL (CARDIAC) 20 MG/ML IV SOLN
INTRAVENOUS | Status: DC | PRN
  Administered 2011-07-25: 40 mg via INTRAVENOUS

## 2011-07-25 MED ORDER — LACTATED RINGERS IV SOLN
INTRAVENOUS | Status: DC
  Administered 2011-07-25: 11:00:00 via INTRAVENOUS

## 2011-07-25 MED ORDER — OXYCODONE HCL 5 MG PO TABS: 5.0000 mg | ORAL_TABLET | Freq: Once | ORAL | Status: AC | PRN

## 2011-07-25 MED ORDER — CEFAZOLIN SODIUM-DEXTROSE 2-3 GM-% IV SOLR
2.0000 g | INTRAVENOUS | Status: AC
  Administered 2011-07-25: 2 g via INTRAVENOUS

## 2011-07-25 MED ORDER — FENTANYL CITRATE 0.05 MG/ML IJ SOLN
50.0000 ug | INTRAMUSCULAR | Status: DC | PRN
  Administered 2011-07-25: 100 ug via INTRAVENOUS

## 2011-07-25 MED ORDER — HYDROMORPHONE HCL PF 1 MG/ML IJ SOLN: 0.2500 mg | INTRAMUSCULAR | Status: DC | PRN

## 2011-07-25 MED ORDER — LOSARTAN POTASSIUM-HCTZ 100-12.5 MG PO TABS: 1.0000 | ORAL_TABLET | Freq: Every day | ORAL | Status: DC

## 2011-07-25 MED ORDER — DROPERIDOL 2.5 MG/ML IJ SOLN
INTRAMUSCULAR | Status: DC | PRN
  Administered 2011-07-25: 0.625 mg via INTRAVENOUS

## 2011-07-25 MED ORDER — METOCLOPRAMIDE HCL 5 MG/ML IJ SOLN: 5.0000 mg | Freq: Three times a day (TID) | INTRAMUSCULAR | Status: DC | PRN

## 2011-07-25 MED ORDER — LEVOTHYROXINE SODIUM 25 MCG PO TABS: 25.0000 ug | ORAL_TABLET | Freq: Every day | ORAL | Status: DC

## 2011-07-25 SURGICAL SUPPLY — 60 items
BANDAGE GAUZE ELAST BULKY 4 IN (GAUZE/BANDAGES/DRESSINGS) ×2 IMPLANT
BENZOIN TINCTURE PRP APPL 2/3 (GAUZE/BANDAGES/DRESSINGS) IMPLANT
BLADE SAW SGTL 83.5X18.5 (BLADE) ×2 IMPLANT
BLADE SURG 15 STRL LF DISP TIS (BLADE) ×1 IMPLANT
BLADE SURG 15 STRL SS (BLADE) ×1
BOWL SMART MIX CTS (DISPOSABLE) ×2 IMPLANT
CANISTER SUCTION 2500CC (MISCELLANEOUS) ×2 IMPLANT
CEMENT BONE SIMPLEX SPEEDSET (Cement) ×2 IMPLANT
CLOTH BEACON ORANGE TIMEOUT ST (SAFETY) ×2 IMPLANT
COVER MAYO STAND STRL (DRAPES) ×2 IMPLANT
COVER TABLE BACK 60X90 (DRAPES) ×2 IMPLANT
DECANTER SPIKE VIAL GLASS SM (MISCELLANEOUS) IMPLANT
DRAPE U-SHAPE 47X51 STRL (DRAPES) ×2 IMPLANT
DRAPE U-SHAPE 76X120 STRL (DRAPES) ×4 IMPLANT
DURAPREP 26ML APPLICATOR (WOUND CARE) ×2 IMPLANT
ELECT REM PT RETURN 9FT ADLT (ELECTROSURGICAL) ×2
ELECTRODE REM PT RTRN 9FT ADLT (ELECTROSURGICAL) ×1 IMPLANT
GAUZE XEROFORM 1X8 LF (GAUZE/BANDAGES/DRESSINGS) ×2 IMPLANT
GLOVE BIO SURGEON STRL SZ 6.5 (GLOVE) ×4 IMPLANT
GLOVE BIOGEL PI IND STRL 7.0 (GLOVE) ×1 IMPLANT
GLOVE BIOGEL PI IND STRL 8 (GLOVE) ×2 IMPLANT
GLOVE BIOGEL PI INDICATOR 7.0 (GLOVE) ×1
GLOVE BIOGEL PI INDICATOR 8 (GLOVE) ×2
GLOVE ECLIPSE 6.5 STRL STRAW (GLOVE) ×2 IMPLANT
GLOVE ORTHO TXT STRL SZ7.5 (GLOVE) ×6 IMPLANT
GOWN BRE IMP PREV XXLGXLNG (GOWN DISPOSABLE) ×4 IMPLANT
GOWN PREVENTION PLUS XLARGE (GOWN DISPOSABLE) ×4 IMPLANT
NEEDLE 1/2 CIR CATGUT .05X1.09 (NEEDLE) IMPLANT
NS IRRIG 1000ML POUR BTL (IV SOLUTION) ×2 IMPLANT
PACK ARTHROSCOPY DSU (CUSTOM PROCEDURE TRAY) ×2 IMPLANT
PACK BASIN DAY SURGERY FS (CUSTOM PROCEDURE TRAY) ×2 IMPLANT
PENCIL BUTTON HOLSTER BLD 10FT (ELECTRODE) ×2 IMPLANT
SHOULDER PEGGED GLENOID (Orthopedic Implant) ×2 IMPLANT
SLEEVE SCD COMPRESS KNEE MED (MISCELLANEOUS) ×2 IMPLANT
SLING ARM IMMOBILIZER LRG (SOFTGOODS) ×2 IMPLANT
SLING ARM IMMOBILIZER MED (SOFTGOODS) IMPLANT
SPONGE GAUZE 4X4 12PLY (GAUZE/BANDAGES/DRESSINGS) ×2 IMPLANT
SPONGE LAP 18X18 X RAY DECT (DISPOSABLE) ×4 IMPLANT
STAPLER VISISTAT (STAPLE) IMPLANT
STAPLER VISISTAT 35W (STAPLE) ×2 IMPLANT
STRIP CLOSURE SKIN 1/2X4 (GAUZE/BANDAGES/DRESSINGS) IMPLANT
SUCTION FRAZIER TIP 10 FR DISP (SUCTIONS) ×2 IMPLANT
SUT ETHIBOND 2 OS 4 DA (SUTURE) IMPLANT
SUT FIBERWIRE #2 38 T-5 BLUE (SUTURE) ×10
SUT VIC AB 2-0 CT1 27 (SUTURE) ×2
SUT VIC AB 2-0 CT1 TAPERPNT 27 (SUTURE) ×2 IMPLANT
SUT VIC AB 2-0 SH 27 (SUTURE) ×2
SUT VIC AB 2-0 SH 27XBRD (SUTURE) ×2 IMPLANT
SUT VIC AB 3-0 SH 27 (SUTURE)
SUT VIC AB 3-0 SH 27X BRD (SUTURE) IMPLANT
SUTURE FIBERWR #2 38 T-5 BLUE (SUTURE) ×5 IMPLANT
SYR 50ML LL SCALE MARK (SYRINGE) IMPLANT
SYR BULB IRRIGATION 50ML (SYRINGE) ×2 IMPLANT
Solar Shoulder Humeral Component (Shoulder) ×2 IMPLANT
Solar Shoulder Humeral Head (Shoulder) ×2 IMPLANT
TAPE PAPER 3X10 WHT MICROPORE (GAUZE/BANDAGES/DRESSINGS) ×2 IMPLANT
TOWEL OR 17X24 6PK STRL BLUE (TOWEL DISPOSABLE) ×4 IMPLANT
TUBE CONNECTING 20X1/4 (TUBING) ×2 IMPLANT
WATER STERILE IRR 1000ML POUR (IV SOLUTION) IMPLANT
YANKAUER SUCT BULB TIP NO VENT (SUCTIONS) ×2 IMPLANT

## 2011-07-25 NOTE — Interval H&P Note (Signed)
History and Physical Interval Note:  07/25/2011 7:36 AM  Mary Harvey  has presented today for surgery, with the diagnosis of LEFT SHOULDER DEGENERATIVE ARTHRITIS  The various methods of treatment have been discussed with the patient and family. After consideration of risks, benefits and other options for treatment, the patient has consented to  Procedure(s) (LRB): TOTAL SHOULDER ARTHROPLASTY (Left) as a surgical intervention .  The patients' history has been reviewed, patient examined, no change in status, stable for surgery.  I have reviewed the patients' chart and labs.  Questions were answered to the patient's satisfaction.     Alontae Chaloux F

## 2011-07-25 NOTE — Anesthesia Postprocedure Evaluation (Signed)
Anesthesia Post Note  Patient: Mary Harvey  Procedure(s) Performed: Procedure(s) (LRB): TOTAL SHOULDER ARTHROPLASTY (Left)  Anesthesia type: General  Patient location: PACU  Post pain: Pain level controlled  Post assessment: Patient's Cardiovascular Status Stable  Last Vitals:  Filed Vitals:   07/25/11 1425  BP:   Pulse: 110  Temp:   Resp:     Post vital signs: Reviewed and stable  Level of consciousness: alert  Complications: No apparent anesthesia complications

## 2011-07-25 NOTE — Brief Op Note (Signed)
07/25/2011  3:33 PM  PATIENT:  Mary Harvey  56 y.o. female  PRE-OPERATIVE DIAGNOSIS:  LEFT SHOULDER DEGENERATIVE ARTHRITIS  POST-OPERATIVE DIAGNOSIS:  LEFT SHOULDER DEGENERATIVE ARTHRITIS  PROCEDURE:  Procedure(s) (LRB): TOTAL SHOULDER ARTHROPLASTY (Left)  SURGEON:  Surgeon(s) and Role:    * Loreta Ave, MD - Primary  PHYSICIAN ASSISTANT: Zonia Kief M   ANESTHESIA:   regional and general  EBL:  Total I/O In: 1330 [P.O.:50; I.V.:1280] Out: -   SPECIMEN:  No Specimen  DISPOSITION OF SPECIMEN:  N/A  COUNTS:  YES  TOURNIQUET:  * No tourniquets in log *   PATIENT DISPOSITION:  PACU - hemodynamically stable.

## 2011-07-25 NOTE — Anesthesia Preprocedure Evaluation (Signed)
Anesthesia Evaluation  Patient identified by MRN, date of birth, ID band Patient awake    Reviewed: Allergy & Precautions, H&P , NPO status , Patient's Chart, lab work & pertinent test results, reviewed documented beta blocker date and time   Airway Mallampati: II TM Distance: >3 FB Neck ROM: full    Dental   Pulmonary neg pulmonary ROS,          Cardiovascular hypertension, On Medications     Neuro/Psych negative neurological ROS  negative psych ROS   GI/Hepatic negative GI ROS, Neg liver ROS,   Endo/Other  negative endocrine ROS  Renal/GU negative Renal ROS  negative genitourinary   Musculoskeletal   Abdominal   Peds  Hematology negative hematology ROS (+)   Anesthesia Other Findings See surgeon's H&P   Reproductive/Obstetrics negative OB ROS                           Anesthesia Physical Anesthesia Plan  ASA: II  Anesthesia Plan: General   Post-op Pain Management:    Induction: Intravenous  Airway Management Planned: Oral ETT  Additional Equipment:   Intra-op Plan:   Post-operative Plan: Extubation in OR  Informed Consent: I have reviewed the patients History and Physical, chart, labs and discussed the procedure including the risks, benefits and alternatives for the proposed anesthesia with the patient or authorized representative who has indicated his/her understanding and acceptance.   Dental Advisory Given  Plan Discussed with: CRNA and Surgeon  Anesthesia Plan Comments:         Anesthesia Quick Evaluation  

## 2011-07-25 NOTE — Progress Notes (Signed)
Assisted Dr. Frederick with left, ultrasound guided, interscalene  block. Side rails up, monitors on throughout procedure. See vital signs in flow sheet. Tolerated Procedure well. 

## 2011-07-25 NOTE — Anesthesia Procedure Notes (Addendum)
Anesthesia Regional Block:  Interscalene brachial plexus block  Pre-Anesthetic Checklist: ,, timeout performed, Correct Patient, Correct Site, Correct Laterality, Correct Procedure, Correct Position, site marked, Risks and benefits discussed,  Surgical consent,  Pre-op evaluation,  At surgeon's request and post-op pain management  Laterality: Left  Prep: chloraprep       Needles:   Needle Type: Other   (Arrow Echogenic)   Needle Length: 9cm  Needle Gauge: 21    Additional Needles:  Procedures: ultrasound guided Interscalene brachial plexus block Narrative:  Start time: 07/25/2011 11:33 AM End time: 07/25/2011 11:40 AM Injection made incrementally with aspirations every 5 mL.  Performed by: Personally  Anesthesiologist: Aldona Lento, MD  Additional Notes: Ultrasound guidance used to: id relevant anatomy, confirm needle position, local anesthetic spread, avoidance of vascular puncture. Picture saved. No complications. Block performed personally by Janetta Hora. Gelene Mink, MD    Interscalene brachial plexus block Procedure Name: Intubation Performed by: York Grice Pre-anesthesia Checklist: Patient identified, Timeout performed, Emergency Drugs available, Suction available and Patient being monitored Patient Re-evaluated:Patient Re-evaluated prior to inductionOxygen Delivery Method: Circle system utilized Preoxygenation: Pre-oxygenation with 100% oxygen Intubation Type: IV induction Ventilation: Mask ventilation without difficulty Laryngoscope Size: Miller and 2 Grade View: Grade I Tube type: Oral Number of attempts: 1 Airway Equipment and Method: Stylet Placement Confirmation: ETT inserted through vocal cords under direct vision,  breath sounds checked- equal and bilateral and positive ETCO2 Secured at: 22 cm Tube secured with: Tape Dental Injury: Teeth and Oropharynx as per pre-operative assessment

## 2011-07-25 NOTE — Transfer of Care (Signed)
Immediate Anesthesia Transfer of Care Note  Patient: Mary Harvey  Procedure(s) Performed: Procedure(s) (LRB): TOTAL SHOULDER ARTHROPLASTY (Left)  Patient Location: PACU  Anesthesia Type: General  Level of Consciousness: awake and alert   Airway & Oxygen Therapy: Patient Spontanous Breathing and Patient connected to face mask oxygen  Post-op Assessment: Report given to PACU RN and Post -op Vital signs reviewed and stable  Post vital signs: Reviewed and stable  Complications: No apparent anesthesia complications

## 2011-07-26 NOTE — Op Note (Signed)
NAMEMarland Kitchen  SHAINNA, FAUX NO.:  [id number]  MEDICAL RECORD NO.:  [id number]  LOCATION:                                 FACILITY:  PHYSICIAN:  Loreta Ave, M.D.      DATE OF BIRTH:  DATE OF PROCEDURE:  07/25/2011 DATE OF DISCHARGE:                              OPERATIVE REPORT   PREOPERATIVE DIAGNOSIS:  Left shoulder end-stage degenerative arthritis.  POSTOPERATIVE DIAGNOSIS:  Left shoulder end-stage degenerative arthritis.  PROCEDURE:  Left total shoulder replacement utilizing Stryker prosthesis.  Cemented pegged #5 glenoid component.  Press-fit 9 mm humeral stem with a 40 mm x 18 mm head.  SURGEON:  Loreta Ave, M.D.  ASSISTANT:  Genene Churn. Barry Dienes, [place], present throughout the entire case and necessary for timely completion of procedure.  ANESTHESIA:  General.  BLOOD LOSS:  Minimal.  SPECIMENS:  None.  CULTURES:  None.  COMPLICATION:  None.  DRESSINGS:  Soft compressive with a shoulder immobilizer.  PROCEDURE:  The patient was brought to the operating room, placed on the operating table in supine position.  After adequate anesthesia had been obtained, placed in beach-chair position on the shoulder positioner, prepped and draped in usual sterile fashion.  Incision along the deltopectoral groove from the coracoid process distally.  Skin and subcutaneous tissues divided.  Hemostasis with cautery.  The deltopectoral interval developed, retractors put in place.  Conjoined tendon elevated and retracted.  Rotator cuff intact.  Subscap was taken down near the lateral attachment, tagged with FiberWire, retracted medially.  Shoulder exposed.  Moderate effusion.  Grade 4 changes throughout.  Periarticular spurs on the humerus removed.  The humeral surgical neck was then cut at appropriate angle for the prosthesis at 30 degrees of retroversion.  Removed and sized for a 40 x 18 mm head. Glenoid exposed.  Grade 4 changes throughout.  Brought up to  good bleeding bone with the #5 reamer.  Drilled, sized, and fitted for the peg #5 component.  Thoroughly irrigated.  Cement prepared, placed on all components and firmly seated.  Held in place until the cement hardened. The humerus was then presented.  Handheld reamers proximally and distally bring up to good sizing and fitting with a #9 component.  A 30- degree of retroversion.  Definitive component was then placed in good position.  After appropriate trials, a 40 x 18 mm head attached.  This gave a nice stable congruent reduction, good sizing, not _________ any area.  Wound copiously irrigated.  Subscap repaired with FiberWire.  Deltopectoral interval allowed to close and the skin closed with Vicryl and staples.  Sterile compressive dressing applied. Shoulder mobilizer applied.  Anesthesia reversed.  Brought to recovery room.  Tolerated surgery well.  No complications.     Loreta Ave, M.D.     DFM/MEDQ  D:  07/25/2011  T:  07/25/2011  Job:  [id number]

## 2011-07-26 NOTE — Discharge Instructions (Signed)
Regional Anesthesia Blocks  1. Numbness or the inability to move the "blocked" extremity may last from 3-48 hours after placement. The length of time depends on the medication injected and your individual response to the medication. If the numbness is not going away after 48 hours, call your surgeon.  2. The extremity that is blocked will need to be protected until the numbness is gone and the  Strength has returned. Because you cannot feel it, you will need to take extra care to avoid injury. Because it may be weak, you may have difficulty moving it or using it. You may not know what position it is in without looking at it while the block is in effect.  3. For blocks in the legs and feet, returning to weight bearing and walking needs to be done carefully. You will need to wait until the numbness is entirely gone and the strength has returned. You should be able to move your leg and foot normally before you try and bear weight or walk. You will need someone to be with you when you first try to ensure you do not fall and possibly risk injury.  4. Bruising and tenderness at the needle site are common side effects and will resolve in a few days.  5. Persistent numbness or new problems with movement should be communicated to the surgeon or the [facility] [phone] [facility] [phone]). Regional Anesthesia Blocks  1. Numbness or the inability to move the "blocked" extremity may last from 3-48 hours after placement. The length of time depends on the medication injected and your individual response to the medication. If the numbness is not going away after 48 hours, call your surgeon.  2. The extremity that is blocked will need to be protected until the numbness is gone and the  Strength has returned. Because you cannot feel it, you will need to take extra care to avoid injury. Because it may be weak, you may have difficulty moving it or using it. You may not know what  position it is in without looking at it while the block is in effect.  3. For blocks in the legs and feet, returning to weight bearing and walking needs to be done carefully. You will need to wait until the numbness is entirely gone and the strength has returned. You should be able to move your leg and foot normally before you try and bear weight or walk. You will need someone to be with you when you first try to ensure you do Regional Anesthesia Blocks  1. Numbness or the inability to move the "blocked" extremity may last from 3-48 hours after placement. The length of time depends on the medication injected and your individual response to the medication. If the numbness is not going away after 48 hours, call your surgeon.  2. The extremity that is blocked will need to be protected until the numbness is gone and the  Strength has returned. Because you cannot feel it, you will need to take extra care to avoid injury. Because it may be weak, you may have difficulty moving it or using it. You may not know what position it is in without looking at it while the block is in effect.  3. For blocks in the legs and feet, returning to weight bearing and walking needs to be done carefully. You will need to wait until the numbness is entirely gone and the strength has returned. You should be able to  move your leg and foot normally before you try and bear weight or walk. You will need someone to be with you when you first try to ensure you do not fall and possibly risk injury.  4. Bruising and tenderness at the needle site are common side effects and will resolve in a few days.  5. Persistent numbness or new problems with movement should be communicated to the surgeon or the [facility] [phone] [facility] [phone]). not fall and possibly risk injury.  4. Bruising and tenderness at the needle site are common side effects and will resolve in a few days.  5. Persistent numbness  or new problems with movement should be communicated to the surgeon or the [facility] [phone] [facility] [phone]). Patient will benefit from ongoing skilled PT services in {setting:3041680} to continue to advance safe functional mobility, address ongoing impairments in ***, and minimize fall risk.

## 2011-08-01 ENCOUNTER — Encounter (HOSPITAL_BASED_OUTPATIENT_CLINIC_OR_DEPARTMENT_OTHER): Payer: Self-pay | Admitting: Orthopedic Surgery

## 2011-09-12 HISTORY — PX: SHOULDER ARTHROSCOPY: SHX128

## 2012-07-24 ENCOUNTER — Encounter (HOSPITAL_BASED_OUTPATIENT_CLINIC_OR_DEPARTMENT_OTHER): Payer: Self-pay | Admitting: *Deleted

## 2012-07-28 ENCOUNTER — Encounter (HOSPITAL_BASED_OUTPATIENT_CLINIC_OR_DEPARTMENT_OTHER): Payer: Self-pay | Admitting: *Deleted

## 2012-07-28 NOTE — Progress Notes (Signed)
Pt was here for total shoulder 6/13-did well-had rt shoulder after that and went hime-to come for bmet and ekg-

## 2012-07-29 ENCOUNTER — Other Ambulatory Visit: Payer: Self-pay | Admitting: Orthopedic Surgery

## 2012-07-29 ENCOUNTER — Encounter (HOSPITAL_BASED_OUTPATIENT_CLINIC_OR_DEPARTMENT_OTHER)
Admission: RE | Admit: 2012-07-29 | Discharge: 2012-07-29 | Disposition: A | Discharge status: Home / Self Care | Payer: BC Managed Care – PPO | Source: Ambulatory Visit | Attending: Orthopedic Surgery | Admitting: Orthopedic Surgery

## 2012-07-29 DIAGNOSIS — S83206A Unspecified tear of unspecified meniscus, current injury, right knee, initial encounter: Secondary | ICD-10-CM

## 2012-07-29 LAB — BASIC METABOLIC PANEL
CO2: 31 mEq/L (ref 19–32)
Glucose, Bld: 115 mg/dL — ABNORMAL HIGH (ref 70–99)
Potassium: 3.7 mEq/L (ref 3.5–5.1)
Sodium: 138 mEq/L (ref 135–145)

## 2012-07-29 NOTE — H&P (Signed)
[facility] [address]   [address] [place], [place] [address] [phone] A Division of [facility]  Loreta Ave, M.D.   Robert A. Thurston Hole, M.D.   Burnell Blanks, M.D.   Eulas Post, M.D.   Lunette Stands, M.D Buford Dresser, M.D.  Charlsie Quest, M.D.   Estell Harpin, M.D.   Melina Fiddler, M.D. Genene Churn. Barry Dienes, PA-C            Kirstin A. Shepperson, PA-C [address], PA-C [place], [place]   RE: Mary Harvey, Mary Harvey   [id number]      DOB: [birth year] PROGRESS NOTE: 03-24-12 Mary Harvey comes in for follow-up. Cortisone injection right knee on 02/25/12. This helped a great deal but only lasted for 3 weeks. Pain has recurred. Mechanical in nature mostly laterally. This gives her a feeling of instability. Some symptoms medially. Previous x-rays showed some degenerative change on the right knee but not extreme and not bone on bone. Our biggest dilemma is that she doesn't want the experience she had on the left. On the left I did a scope for meniscus tear where there were moderate degenerative changes. Following that she only transiently improved rapidly progressed and we converted her to a total knee replacement. She's doing well with that. X-rays of that knee in January looked good. Good pain relief alignment stability and motion.  In regards to the right knee we've reached a point where we're going to have to do something. MRI to delineate pathology. In the interim she's asked for an injection to give her some relief. I told her this isn't a long-term cure and she understands. Follow up after MRI to delineate therapeutic recommendations. The big question is whether or not to do a scope. I want to see a significant meniscus tear and less degenerative change on her scan. In regards to her right shoulder she's progressed well. She's status post decompression rotator cuff repair by me 10/07/11. Good pain relief and function.  Continue home exercise program.  Remaining history general exam is outlined included in the chart. More than 15 minutes spent face to face covering this with her.  Loreta Ave, M.D.  Ar[facility]an Melroy/[facility] [address]   [address] [place], [place] [address] [phone] A Division of [facility]  Loreta Ave, M.D.   Robert A. Thurston Hole, M.D.   Burnell Blanks, M.D.   Eulas Post, M.D.   Lunette Stands, M.D Buford Dresser, M.D.  Charlsie Quest, M.D.   Estell Harpin, M.D.   Melina Fiddler, M.D. Genene Churn. Barry Dienes, PA-C            Kirstin A. Shepperson, PA-C [place], PA-C [place], [place]   RE: Mary Harvey, Mary Harvey   [id number]      DOB: 11-Sep-1955 PROGRESS NOTE: 04-10-12 57 year old black female with history of right knee pain returns to review MRI. Scan showed a large medial meniscus tear and marked to severe patellofemoral degenerative changes. She continues to have knee pain and mechanical symptoms.   EXAMINATION: Alert and oriented x3 in no acute distress. Gait is antalgic. Right knee good range of motion. Positive McMurray's. Exquisite tenderness at the medial joint line. Some swelling without significant palpable effusion. Ligaments stable. Calf non-tender neurovascularly intact. Skin warm and dry. No increase in respiratory effort.   DISPOSITION: She's advised best treatment option is outpatient arthroscopy with debridement partial medial meniscectomy. Since  medial and lateral compartments look good do not see arthritis on the scan do not feel total knee replacement is indicated and despite the amount of patellofemoral wear she has hopefully she'll be able to get by for a while. Preoperative paperwork filled out. All questions answered.  Loreta Ave, M.D.  Electronically verified by Loreta Ave, M.D. DFM(JMO):kh D 04-14-12 T 04-14-12

## 2012-07-30 ENCOUNTER — Encounter (HOSPITAL_BASED_OUTPATIENT_CLINIC_OR_DEPARTMENT_OTHER): Payer: Self-pay | Admitting: Anesthesiology

## 2012-07-30 ENCOUNTER — Encounter (HOSPITAL_BASED_OUTPATIENT_CLINIC_OR_DEPARTMENT_OTHER)
Admission: RE | Disposition: A | Discharge status: Home / Self Care | Payer: Self-pay | Source: Ambulatory Visit | Attending: Orthopedic Surgery

## 2012-07-30 ENCOUNTER — Other Ambulatory Visit: Payer: Self-pay | Admitting: Orthopedic Surgery

## 2012-07-30 ENCOUNTER — Ambulatory Visit (HOSPITAL_BASED_OUTPATIENT_CLINIC_OR_DEPARTMENT_OTHER): Payer: BC Managed Care – PPO | Admitting: Anesthesiology

## 2012-07-30 ENCOUNTER — Encounter (HOSPITAL_BASED_OUTPATIENT_CLINIC_OR_DEPARTMENT_OTHER): Payer: Self-pay | Admitting: *Deleted

## 2012-07-30 ENCOUNTER — Ambulatory Visit (HOSPITAL_BASED_OUTPATIENT_CLINIC_OR_DEPARTMENT_OTHER)
Admission: RE | Admit: 2012-07-30 | Discharge: 2012-07-30 | Disposition: A | Discharge status: Home / Self Care | Payer: BC Managed Care – PPO | Source: Ambulatory Visit | Attending: Orthopedic Surgery | Admitting: Orthopedic Surgery

## 2012-07-30 DIAGNOSIS — Z01812 Encounter for preprocedural laboratory examination: Secondary | ICD-10-CM | POA: Insufficient documentation

## 2012-07-30 DIAGNOSIS — Z01810 Encounter for preprocedural cardiovascular examination: Secondary | ICD-10-CM | POA: Insufficient documentation

## 2012-07-30 DIAGNOSIS — M23329 Other meniscus derangements, posterior horn of medial meniscus, unspecified knee: Secondary | ICD-10-CM | POA: Insufficient documentation

## 2012-07-30 DIAGNOSIS — M2240 Chondromalacia patellae, unspecified knee: Secondary | ICD-10-CM | POA: Insufficient documentation

## 2012-07-30 DIAGNOSIS — M23302 Other meniscus derangements, unspecified lateral meniscus, unspecified knee: Secondary | ICD-10-CM | POA: Insufficient documentation

## 2012-07-30 HISTORY — DX: Encounter for fitting and adjustment of spectacles and contact lenses: Z46.0

## 2012-07-30 HISTORY — PX: KNEE ARTHROSCOPY WITH MEDIAL MENISECTOMY: SHX5651

## 2012-07-30 HISTORY — DX: Presence of dental prosthetic device (complete) (partial): Z97.2

## 2012-07-30 SURGERY — ARTHROSCOPY, KNEE, WITH MEDIAL MENISCECTOMY
Anesthesia: General | Site: Knee | Laterality: Right | Wound class: Clean

## 2012-07-30 MED ORDER — LIDOCAINE HCL (CARDIAC) 20 MG/ML IV SOLN
INTRAVENOUS | Status: DC | PRN
  Administered 2012-07-30: 80 mg via INTRAVENOUS

## 2012-07-30 MED ORDER — OXYCODONE HCL 5 MG/5ML PO SOLN: 5.0000 mg | Freq: Once | ORAL | Status: DC | PRN

## 2012-07-30 MED ORDER — MIDAZOLAM HCL 2 MG/2ML IJ SOLN: 1.0000 mg | INTRAMUSCULAR | Status: DC | PRN

## 2012-07-30 MED ORDER — ONDANSETRON HCL 4 MG/2ML IJ SOLN
INTRAMUSCULAR | Status: DC | PRN
  Administered 2012-07-30: 4 mg via INTRAVENOUS

## 2012-07-30 MED ORDER — METHYLPREDNISOLONE ACETATE 80 MG/ML IJ SUSP
INTRAMUSCULAR | Status: DC | PRN
  Administered 2012-07-30: 80 mg via INTRA_ARTICULAR

## 2012-07-30 MED ORDER — LACTATED RINGERS IV SOLN
INTRAVENOUS | Status: DC
  Administered 2012-07-30: 07:00:00 via INTRAVENOUS
  Administered 2012-07-30: 10 mL/h via INTRAVENOUS

## 2012-07-30 MED ORDER — HYDROMORPHONE HCL PF 1 MG/ML IJ SOLN
0.2500 mg | INTRAMUSCULAR | Status: DC | PRN
  Administered 2012-07-30 (×2): 0.5 mg via INTRAVENOUS

## 2012-07-30 MED ORDER — FENTANYL CITRATE 0.05 MG/ML IJ SOLN: 50.0000 ug | INTRAMUSCULAR | Status: DC | PRN

## 2012-07-30 MED ORDER — DIPHENHYDRAMINE HCL 50 MG/ML IJ SOLN
12.5000 mg | Freq: Once | INTRAMUSCULAR | Status: AC
  Administered 2012-07-30: 12.5 mg via INTRAVENOUS

## 2012-07-30 MED ORDER — DEXAMETHASONE SODIUM PHOSPHATE 4 MG/ML IJ SOLN
INTRAMUSCULAR | Status: DC | PRN
  Administered 2012-07-30: 10 mg via INTRAVENOUS

## 2012-07-30 MED ORDER — CEFAZOLIN SODIUM-DEXTROSE 2-3 GM-% IV SOLR
2.0000 g | INTRAVENOUS | Status: AC
  Administered 2012-07-30: 2 g via INTRAVENOUS

## 2012-07-30 MED ORDER — SODIUM CHLORIDE 0.9 % IR SOLN
Status: DC | PRN
  Administered 2012-07-30: 2500 mL

## 2012-07-30 MED ORDER — OXYCODONE HCL 5 MG PO TABS: 5.0000 mg | ORAL_TABLET | Freq: Once | ORAL | Status: DC | PRN

## 2012-07-30 MED ORDER — PROPOFOL 10 MG/ML IV BOLUS
INTRAVENOUS | Status: DC | PRN
  Administered 2012-07-30: 200 mg via INTRAVENOUS

## 2012-07-30 MED ORDER — FENTANYL CITRATE 0.05 MG/ML IJ SOLN
INTRAMUSCULAR | Status: DC | PRN
  Administered 2012-07-30: 25 ug via INTRAVENOUS
  Administered 2012-07-30: 50 ug via INTRAVENOUS
  Administered 2012-07-30: 25 ug via INTRAVENOUS

## 2012-07-30 MED ORDER — MIDAZOLAM HCL 5 MG/5ML IJ SOLN
INTRAMUSCULAR | Status: DC | PRN
  Administered 2012-07-30: 2 mg via INTRAVENOUS

## 2012-07-30 MED ORDER — ONDANSETRON HCL 4 MG/2ML IJ SOLN: 4.0000 mg | Freq: Four times a day (QID) | INTRAMUSCULAR | Status: DC | PRN

## 2012-07-30 MED ORDER — LACTATED RINGERS IV SOLN: INTRAVENOUS | Status: DC

## 2012-07-30 MED ORDER — CHLORHEXIDINE GLUCONATE 4 % EX LIQD: 60.0000 mL | Freq: Once | CUTANEOUS | Status: DC

## 2012-07-30 MED ORDER — BUPIVACAINE HCL (PF) 0.5 % IJ SOLN
INTRAMUSCULAR | Status: DC | PRN
  Administered 2012-07-30: 20 mL via INTRA_ARTICULAR

## 2012-07-30 SURGICAL SUPPLY — 40 items
BANDAGE ELASTIC 6 VELCRO ST LF (GAUZE/BANDAGES/DRESSINGS) ×2 IMPLANT
BLADE CUDA 5.5 (BLADE) IMPLANT
BLADE CUDA GRT WHITE 3.5 (BLADE) IMPLANT
BLADE CUTTER GATOR 3.5 (BLADE) ×2 IMPLANT
BLADE CUTTER MENIS 5.5 (BLADE) IMPLANT
BLADE GREAT WHITE 4.2 (BLADE) ×2 IMPLANT
BUR OVAL 4.0 (BURR) IMPLANT
CANISTER OMNI JUG 16 LITER (MISCELLANEOUS) ×2 IMPLANT
CANISTER SUCTION 2500CC (MISCELLANEOUS) IMPLANT
CLOTH BEACON ORANGE TIMEOUT ST (SAFETY) ×2 IMPLANT
CUTTER MENISCUS  4.2MM (BLADE)
CUTTER MENISCUS 4.2MM (BLADE) IMPLANT
DRAPE ARTHROSCOPY W/POUCH 90 (DRAPES) ×2 IMPLANT
DURAPREP 26ML APPLICATOR (WOUND CARE) ×2 IMPLANT
ELECT MENISCUS 165MM 90D (ELECTRODE) IMPLANT
ELECT REM PT RETURN 9FT ADLT (ELECTROSURGICAL)
ELECTRODE REM PT RTRN 9FT ADLT (ELECTROSURGICAL) IMPLANT
GAUZE XEROFORM 1X8 LF (GAUZE/BANDAGES/DRESSINGS) ×2 IMPLANT
GLOVE BIOGEL PI IND STRL 7.0 (GLOVE) ×1 IMPLANT
GLOVE BIOGEL PI IND STRL 8 (GLOVE) ×1 IMPLANT
GLOVE BIOGEL PI INDICATOR 7.0 (GLOVE) ×1
GLOVE BIOGEL PI INDICATOR 8 (GLOVE) ×1
GLOVE ECLIPSE 6.5 STRL STRAW (GLOVE) ×2 IMPLANT
GLOVE ORTHO TXT STRL SZ7.5 (GLOVE) ×2 IMPLANT
GLOVE SURG ORTHO 8.0 STRL STRW (GLOVE) ×2 IMPLANT
GOWN PREVENTION PLUS XLARGE (GOWN DISPOSABLE) ×2 IMPLANT
GOWN STRL REIN 2XL XLG LVL4 (GOWN DISPOSABLE) ×4 IMPLANT
HOLDER KNEE FOAM BLUE (MISCELLANEOUS) ×2 IMPLANT
IV NS IRRIG 3000ML ARTHROMATIC (IV SOLUTION) ×4 IMPLANT
KNEE WRAP E Z 3 GEL PACK (MISCELLANEOUS) ×2 IMPLANT
PACK ARTHROSCOPY DSU (CUSTOM PROCEDURE TRAY) ×2 IMPLANT
PACK BASIN DAY SURGERY FS (CUSTOM PROCEDURE TRAY) ×2 IMPLANT
PENCIL BUTTON HOLSTER BLD 10FT (ELECTRODE) IMPLANT
SET ARTHROSCOPY TUBING (MISCELLANEOUS) ×1
SET ARTHROSCOPY TUBING LN (MISCELLANEOUS) ×1 IMPLANT
SPONGE GAUZE 4X4 12PLY (GAUZE/BANDAGES/DRESSINGS) ×2 IMPLANT
SUT ETHILON 3 0 PS 1 (SUTURE) ×2 IMPLANT
SUT VIC AB 3-0 FS2 27 (SUTURE) IMPLANT
TOWEL OR 17X24 6PK STRL BLUE (TOWEL DISPOSABLE) ×2 IMPLANT
WATER STERILE IRR 1000ML POUR (IV SOLUTION) ×2 IMPLANT

## 2012-07-30 NOTE — Interval H&P Note (Signed)
History and Physical Interval Note:  07/30/2012 7:30 AM  Mary Harvey  has presented today for surgery, with the diagnosis of RIGHT KNEE MEDIAL MENISCUS TEAR 836  The various methods of treatment have been discussed with the patient and family. After consideration of risks, benefits and other options for treatment, the patient has consented to  Procedure(s): KNEE ARTHROSCOPY WITH MEDIAL MENISECTOMY (Right) as a surgical intervention .  The patient's history has been reviewed, patient examined, no change in status, stable for surgery.  I have reviewed the patient's chart and labs.  Questions were answered to the patient's satisfaction.     Emmit Oriley F

## 2012-07-30 NOTE — Op Note (Signed)
NAMEDANELIA, SNODGRASS NO.:  [id number]  MEDICAL RECORD NO.:  [id number]  LOCATION:                               FACILITY:  [facility]  PHYSICIAN:  Loreta Ave, M.D. DATE OF BIRTH:  01-23-1956  DATE OF PROCEDURE:  07/30/2012 DATE OF DISCHARGE:  07/30/2012                              OPERATIVE REPORT   PREOPERATIVE DIAGNOSES:  Right knee chondromalacia patella, medial meniscus tear.  POSTOPERATIVE DIAGNOSES:  Right knee chondromalacia patella, medial meniscus tear with a displaced complex tear posterior third medial meniscus.  Radial tearing midportion, lateral meniscus.  A small ACL cyst.  Mild ACL incompetence without significant tearing just wear. Grade 3 and 4 changes of patella.  PROCEDURES:  Right knee exam under anesthesia, arthroscopy. Chondroplasty of patella.  Excision of ACL cyst.  Partial, medial, and lateral meniscectomies.  SURGEON:  Loreta Ave, M.D.  ASSISTANT:  Janace Litten, OPA  ANESTHESIA:  General.  BLOOD LOSS:  Minimal.  SPECIMENS:  None.  CULTURES:  None.  COMPLICATIONS:  None.  DRESSING:  Soft compressive.  TOURNIQUET:  Not employed.  DESCRIPTION OF PROCEDURE:  The patient was brought to the operating room, placed on the operating table in supine position.  After adequate anesthesia had been obtained, leg holder applied.  Leg prepped and draped in usual sterile fashion.  Two portals, one each medial and lateral parapatellar.  Arthroscope was induced.  Knee distended and inspected.  Lateral tracking but no tethering of patella.  Extensive grade 3 and 4 changes of patella.  Chondroplasty to a stable surface. Mild changes on the trochlea.  ACL had a lot of atresia and stretch but still came to the endpoint.  A small 4-5 mm cyst at the base excised. PCL intact.  Laterally, the compartment looked good.  There was a radial tear in the midportion.  This all stressed out and tapered in smoothly in the meniscus.  Medially,  a little wear on the condyle, not too extensive.  Marked complex tearing posterior third meniscus irreparable. Taken out to a stable rim.  Tapered into remaining meniscus.  Entire knee examined.  No other findings were appreciated. Instruments were completely removed.  Knee injected with Depo-Medrol and Marcaine.  Portals were closed with nylon.  Sterile compressive dressing applied.  Anesthesia reversed.  Brought to the recovery room.  Tolerated the surgery well.  No complications.     Loreta Ave, M.D.     DFM/MEDQ  D:  07/30/2012  T:  07/30/2012  Job:  [id number]

## 2012-07-30 NOTE — Transfer of Care (Signed)
Immediate Anesthesia Transfer of Care Note  Patient: Mary Harvey  Procedure(s) Performed: Procedure(s): KNEE ARTHROSCOPY WITH MEDIAL AND LATERAL MENISECTOMIES, CHONDROPLASTY OF PATELLA (Right)  Patient Location: PACU  Anesthesia Type:General  Level of Consciousness: sedated and patient cooperative  Airway & Oxygen Therapy: Patient Spontanous Breathing and Patient connected to face mask oxygen  Post-op Assessment: Report given to PACU RN and Post -op Vital signs reviewed and stable  Post vital signs: Reviewed and stable  Complications: No apparent anesthesia complications

## 2012-07-30 NOTE — Anesthesia Preprocedure Evaluation (Signed)
Anesthesia Evaluation  Patient identified by MRN, date of birth, ID band Patient awake    Reviewed: Allergy & Precautions, H&P , NPO status , Patient's Chart, lab work & pertinent test results  Airway Mallampati: II  Neck ROM: full    Dental   Pulmonary          Cardiovascular hypertension,     Neuro/Psych    GI/Hepatic   Endo/Other  Hypothyroidism obese  Renal/GU      Musculoskeletal  (+) Arthritis -,   Abdominal   Peds  Hematology   Anesthesia Other Findings   Reproductive/Obstetrics                           Anesthesia Physical Anesthesia Plan  ASA: II  Anesthesia Plan: General   Post-op Pain Management:    Induction: Intravenous  Airway Management Planned: LMA  Additional Equipment:   Intra-op Plan:   Post-operative Plan:   Informed Consent: I have reviewed the patients History and Physical, chart, labs and discussed the procedure including the risks, benefits and alternatives for the proposed anesthesia with the patient or authorized representative who has indicated his/her understanding and acceptance.     Plan Discussed with: CRNA, Anesthesiologist and Surgeon  Anesthesia Plan Comments:         Anesthesia Quick Evaluation

## 2012-07-30 NOTE — Anesthesia Postprocedure Evaluation (Signed)
Anesthesia Post Note  Patient: Mary Harvey  Procedure(s) Performed: Procedure(s) (LRB): KNEE ARTHROSCOPY WITH MEDIAL AND LATERAL MENISECTOMIES, CHONDROPLASTY OF PATELLA (Right)  Anesthesia type: General  Patient location: PACU  Post pain: Pain level controlled and Adequate analgesia  Post assessment: Post-op Vital signs reviewed, Patient's Cardiovascular Status Stable, Respiratory Function Stable, Patent Airway and Pain level controlled  Last Vitals:  Filed Vitals:   07/30/12 0845  BP: 154/120  Pulse: 90  Temp:   Resp: 13    Post vital signs: Reviewed and stable  Level of consciousness: awake, alert  and oriented  Complications: No apparent anesthesia complications

## 2012-07-30 NOTE — Anesthesia Procedure Notes (Signed)
Procedure Name: LMA Insertion Date/Time: 07/30/2012 7:39 AM Performed by: Gar Gibbon Pre-anesthesia Checklist: Patient identified, Emergency Drugs available, Suction available and Patient being monitored Patient Re-evaluated:Patient Re-evaluated prior to inductionOxygen Delivery Method: Circle System Utilized Preoxygenation: Pre-oxygenation with 100% oxygen Intubation Type: IV induction Ventilation: Mask ventilation without difficulty LMA: LMA inserted LMA Size: 4.0 Number of attempts: 1 Airway Equipment and Method: bite block Placement Confirmation: positive ETCO2 Tube secured with: Tape Dental Injury: Teeth and Oropharynx as per pre-operative assessment

## 2012-08-03 ENCOUNTER — Encounter (HOSPITAL_BASED_OUTPATIENT_CLINIC_OR_DEPARTMENT_OTHER): Payer: Self-pay | Admitting: Orthopedic Surgery

## 2012-09-08 ENCOUNTER — Other Ambulatory Visit: Payer: Self-pay | Admitting: Internal Medicine

## 2012-09-08 DIAGNOSIS — E042 Nontoxic multinodular goiter: Secondary | ICD-10-CM

## 2012-09-29 ENCOUNTER — Ambulatory Visit (HOSPITAL_COMMUNITY): Payer: BC Managed Care – PPO

## 2012-10-20 ENCOUNTER — Ambulatory Visit (HOSPITAL_COMMUNITY)
Admission: RE | Admit: 2012-10-20 | Discharge: 2012-10-20 | Disposition: A | Discharge status: Home / Self Care | Payer: BC Managed Care – PPO | Source: Ambulatory Visit | Attending: Internal Medicine | Admitting: Internal Medicine

## 2012-10-20 DIAGNOSIS — E042 Nontoxic multinodular goiter: Secondary | ICD-10-CM

## 2012-10-28 ENCOUNTER — Other Ambulatory Visit: Payer: Self-pay | Admitting: Physical Medicine and Rehabilitation

## 2012-10-28 DIAGNOSIS — M549 Dorsalgia, unspecified: Secondary | ICD-10-CM

## 2012-11-06 ENCOUNTER — Ambulatory Visit
Admission: RE | Admit: 2012-11-06 | Discharge: 2012-11-06 | Disposition: A | Discharge status: Home / Self Care | Payer: BC Managed Care – PPO | Source: Ambulatory Visit | Attending: Physical Medicine and Rehabilitation | Admitting: Physical Medicine and Rehabilitation

## 2012-11-06 DIAGNOSIS — M549 Dorsalgia, unspecified: Secondary | ICD-10-CM

## 2012-11-10 ENCOUNTER — Other Ambulatory Visit: Payer: BC Managed Care – PPO

## 2013-11-22 ENCOUNTER — Telehealth: Payer: Self-pay

## 2013-11-25 NOTE — Telephone Encounter (Signed)
Refill but this is not our patient

## 2015-11-07 ENCOUNTER — Other Ambulatory Visit: Payer: Self-pay | Admitting: Internal Medicine

## 2015-11-07 DIAGNOSIS — E049 Nontoxic goiter, unspecified: Secondary | ICD-10-CM

## 2015-11-15 ENCOUNTER — Ambulatory Visit (HOSPITAL_COMMUNITY)
Admission: RE | Admit: 2015-11-15 | Discharge: 2015-11-15 | Disposition: A | Discharge status: Home / Self Care | Payer: BLUE CROSS/BLUE SHIELD | Source: Ambulatory Visit | Attending: Internal Medicine | Admitting: Internal Medicine

## 2015-11-15 DIAGNOSIS — E049 Nontoxic goiter, unspecified: Secondary | ICD-10-CM

## 2017-05-30 ENCOUNTER — Other Ambulatory Visit: Payer: Self-pay | Admitting: Endocrinology

## 2017-05-30 DIAGNOSIS — E049 Nontoxic goiter, unspecified: Secondary | ICD-10-CM

## 2017-06-06 ENCOUNTER — Other Ambulatory Visit: Payer: BLUE CROSS/BLUE SHIELD

## 2017-06-11 ENCOUNTER — Ambulatory Visit
Admission: RE | Admit: 2017-06-11 | Discharge: 2017-06-11 | Disposition: A | Discharge status: Home / Self Care | Payer: Self-pay | Source: Ambulatory Visit | Attending: Endocrinology | Admitting: Endocrinology

## 2017-06-11 DIAGNOSIS — E049 Nontoxic goiter, unspecified: Secondary | ICD-10-CM

## 2017-10-07 ENCOUNTER — Telehealth (INDEPENDENT_AMBULATORY_CARE_PROVIDER_SITE_OTHER): Payer: Self-pay | Admitting: Family Medicine

## 2017-10-07 NOTE — Telephone Encounter (Signed)
Returned call to patient left message to return call to reschedule appointment   [phone]

## 2017-10-10 ENCOUNTER — Ambulatory Visit (INDEPENDENT_AMBULATORY_CARE_PROVIDER_SITE_OTHER): Payer: BLUE CROSS/BLUE SHIELD | Admitting: Family Medicine

## 2017-10-20 ENCOUNTER — Ambulatory Visit (INDEPENDENT_AMBULATORY_CARE_PROVIDER_SITE_OTHER): Payer: Self-pay

## 2017-10-20 ENCOUNTER — Ambulatory Visit (INDEPENDENT_AMBULATORY_CARE_PROVIDER_SITE_OTHER): Payer: BLUE CROSS/BLUE SHIELD | Admitting: Family Medicine

## 2017-10-20 ENCOUNTER — Telehealth (INDEPENDENT_AMBULATORY_CARE_PROVIDER_SITE_OTHER): Payer: Self-pay | Admitting: Physical Medicine and Rehabilitation

## 2017-10-20 ENCOUNTER — Encounter (INDEPENDENT_AMBULATORY_CARE_PROVIDER_SITE_OTHER): Payer: Self-pay

## 2017-10-20 DIAGNOSIS — M25561 Pain in right knee: Secondary | ICD-10-CM | POA: Diagnosis not present

## 2017-10-20 MED ORDER — IBUPROFEN-FAMOTIDINE 800-26.6 MG PO TABS: 1.0000 | ORAL_TABLET | Freq: Three times a day (TID) | ORAL | 3 refills | Status: DC | PRN

## 2017-10-20 NOTE — Telephone Encounter (Signed)
Please advise 

## 2017-10-20 NOTE — Telephone Encounter (Signed)
I did not see her for her neck today, but she mentioned it on her way out.  She states that she had seen Dr. Alvester Morin for injections in the past and plans to schedule some more with him.

## 2017-10-20 NOTE — Telephone Encounter (Signed)
Patient saw Dr. Prince Rome today and was discussing some neck pain.  She is wanting to schedule an injection with Dr. Alvester Morin.  CB#[phone].  Thank you.

## 2017-10-20 NOTE — Progress Notes (Signed)
   Office Visit Note   Patient: Mary Harvey           Date of Birth: [birth year]           MRN: [id number] Visit Date: 10/20/2017 Requested by: Hezzie Bump, FNP [address] [place], [place] [address] PCP: Hezzie Bump, FNP  Subjective: Chief Complaint  Patient presents with  . Right Knee - Pain    HPI: She is a 62 year old with right knee pain.  Symptoms started a few months ago.  Pain mostly on the medial aspect.  She has a history of left knee arthritis requiring replacement a few years ago and she was afraid she might need it on the right.  It does not hurt quite as much as her left knee did before surgery.  No locking or giving way.  She has a history of right knee scope with partial meniscectomy in the past.  She has done well with Duexis over the years.  She has been out of it and would like a renewal.              ROS: She has hypertension and hypothyroidism as well as diabetes.  All other systems were negative.  Objective: Vital Signs: There were no vitals taken for this visit.  Physical Exam:  Right knee: No pain with internal hip rotation.  1+ patellofemoral crepitus with mild pain on patellar compression.  Trace effusion, no warmth or erythema.  Full range of motion with medial and lateral joint line tenderness, no palpable click on McMurray's.  Ligaments feel stable.  Imaging: Mild tricompartmental degenerative spurring but joint space is still well preserved.  No obvious loose body.  Assessment & Plan: 1.  Right knee pain with tricompartmental DJD -Samples and prescription for Duexis.  Trial of glucosamine.  Orders on injection if symptoms worsen.   Follow-Up Instructions: No follow-ups on file.     Procedures: None today.   PMFS History: There are no active problems to display for this patient.  Past Medical History:  Diagnosis Date  . Arthritis   . Chronic pain   . Contact lens/glasses fitting    wears contacts or glasses  .  Hypertension   . Hypothyroidism   . Insomnia   . Seasonal allergies   . Wears partial dentures    top and bottom partials    No family history on file.  Past Surgical History:  Procedure Laterality Date  . CHOLECYSTECTOMY    . KNEE ARTHROSCOPY Left 04/09/2007  . KNEE ARTHROSCOPY WITH MEDIAL MENISECTOMY Right 07/30/2012   Procedure: KNEE ARTHROSCOPY WITH MEDIAL AND LATERAL MENISECTOMIES, CHONDROPLASTY OF PATELLA;  Surgeon: Loreta Ave, MD;  Location: [facility];  Service: Orthopedics;  Laterality: Right;  . SHOULDER ARTHROSCOPY  8/13   right  . TOTAL KNEE ARTHROPLASTY Left 05/11/2008  . TOTAL SHOULDER ARTHROPLASTY  07/25/2011   Procedure: TOTAL SHOULDER ARTHROPLASTY;  Surgeon: Loreta Ave, MD;  Location: [facility];  Service: Orthopedics;  Laterality: Left;   Social History   Occupational History  . Not on file  Tobacco Use  . Smoking status: Never Smoker  Substance and Sexual Activity  . Alcohol use: Yes  . Drug use: No  . Sexual activity: Not on file

## 2017-10-20 NOTE — Patient Instructions (Signed)
   Glucosamine Sulfate:  1,000 mg twice daily    

## 2017-10-20 NOTE — Telephone Encounter (Signed)
Do you know anything about this one? I see no mention of neck in today's note. Last cervical ESI was in 2013.

## 2017-10-21 NOTE — Telephone Encounter (Signed)
Scheduled for OV 11/07/17 at 1000.

## 2017-11-07 ENCOUNTER — Ambulatory Visit (INDEPENDENT_AMBULATORY_CARE_PROVIDER_SITE_OTHER): Payer: BLUE CROSS/BLUE SHIELD | Admitting: Physical Medicine and Rehabilitation

## 2018-03-31 ENCOUNTER — Ambulatory Visit (INDEPENDENT_AMBULATORY_CARE_PROVIDER_SITE_OTHER): Payer: BLUE CROSS/BLUE SHIELD | Admitting: Orthopaedic Surgery

## 2018-04-17 ENCOUNTER — Ambulatory Visit (INDEPENDENT_AMBULATORY_CARE_PROVIDER_SITE_OTHER): Payer: BLUE CROSS/BLUE SHIELD | Admitting: Orthopaedic Surgery

## 2018-04-17 ENCOUNTER — Encounter (INDEPENDENT_AMBULATORY_CARE_PROVIDER_SITE_OTHER): Payer: Self-pay | Admitting: Orthopaedic Surgery

## 2018-04-17 VITALS — BP 160/92 | HR 73 | Ht 63.0 in | Wt 202.0 lb

## 2018-04-17 DIAGNOSIS — M1711 Unilateral primary osteoarthritis, right knee: Secondary | ICD-10-CM

## 2018-04-17 MED ORDER — METHYLPREDNISOLONE ACETATE 40 MG/ML IJ SUSP
40.0000 mg | INTRAMUSCULAR | Status: AC | PRN
  Administered 2018-04-17: 40 mg via INTRA_ARTICULAR

## 2018-04-17 MED ORDER — BUPIVACAINE HCL 0.25 % IJ SOLN
4.0000 mL | INTRAMUSCULAR | Status: AC | PRN
  Administered 2018-04-17: 4 mL via INTRA_ARTICULAR

## 2018-04-17 MED ORDER — LIDOCAINE HCL 1 % IJ SOLN
0.5000 mL | INTRAMUSCULAR | Status: AC | PRN
  Administered 2018-04-17: .5 mL

## 2018-04-17 NOTE — Progress Notes (Signed)
Office Visit Note   Patient: Mary Harvey           Date of Birth: 1956/02/09           MRN: [id number] Visit Date: 04/17/2018              Requested by: Hezzie Bump, FNP [address] [place], [place] [address] PCP: Hezzie Bump, FNP   Assessment & Plan: Visit Diagnoses:  1. Unilateral primary osteoarthritis, right knee    2.  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 Plan: Injection performed with good relief.  She can follow-up with me in 3 months.  Previous x-ray results were reviewed.  Multiple exercises were given to her for quad strengthening of her left thigh which she can do on her own I will recheck her in 3 months.  She can call me if she like to go to formal physical therapy.  Follow-Up Instructions: No follow-ups on file.   Orders:  Orders Placed This Encounter  Procedures  . Large Joint Inj   No orders of the defined types were placed in this encounter.     Procedures: Large Joint Inj: R knee on 04/17/2018 1:24 PM Indications: pain and joint swelling Details: 22 G 1.5 in needle, anterolateral approach  Arthrogram: No  Medications: 40 mg methylPREDNISolone acetate 40 MG/ML; 0.5 mL lidocaine 1 %; 4 mL bupivacaine 0.25 % Outcome: tolerated well, no immediate complications Procedure, treatment alternatives, risks and benefits explained, specific risks discussed. Consent was given by the patient. Immediately prior to procedure a time out was called to verify the correct patient, procedure, equipment, support staff and site/side marked as required. Patient was prepped and draped in the usual sterile fashion.       Clinical Data: No additional findings.   Subjective: Chief Complaint  Patient presents with  . Right Knee - Pain    HPI 63 year old female with problems with progressive right knee varus osteoarthritis.  Previous knee arthroscopy with partial medial lateral meniscectomies in 2014.  She had a  previous surgery by Dr. Richardson Landry with total knee arthroplasty on the left knee in the past which is still giving her some problems.  Right knee has had burning pain she is used glucosamine also Duexis and intermittent ice.  She works at [employer] job in [place] and states she is getting ready to start having to work overtime and states she will need to get an injection so that she is able to tolerate overtime work.  She has been told she has some borderline diabetes was started on Trulicity and her A1c was 5.5.  Review of Systems positive for knee problems as above , prediabetes.  Otherwise noncontributory 14 point systems.  She does take Synthroid also HCTZ for hypertension.   Objective: Vital Signs: BP (!) 160/92   Pulse 73   Ht 5\' 3"  (1.6 m)   Wt 202 lb (91.6 kg)   BMI 35.78 kg/m   Physical Exam Constitutional:      Appearance: She is well-developed.  HENT:     Head: Normocephalic.     Right Ear: External ear normal.     Left Ear: External ear normal.  Eyes:     Pupils: Pupils are equal, round, and reactive to light.  Neck:     Thyroid: No thyromegaly.     Trachea: No tracheal deviation.  Cardiovascular:     Rate and Rhythm: Normal rate.  Pulmonary:  Effort: Pulmonary effort is normal.  Abdominal:     Palpations: Abdomen is soft.  Skin:    General: Skin is warm and dry.  Neurological:     Mental Status: She is alert and oriented to person, place, and time.  Psychiatric:        Behavior: Behavior normal.     Ortho Exam patient has tenderness more medial joint line than lateral on the right knee she is amatory slight right knee limp negative logroll.  Well-healed midline incision left knee with slight spreading of the scar distal pulses are intact negative straight leg raising 90 degrees.  No rash over exposed skin no venous stasis changes no pitting edema.  Previous x-rays are reviewed which showed joint line narrowing marginal osteophytes more medial than lateral  joint line narrowing and patellofemoral degenerative changes mild to moderate.  Specialty Comments:  No specialty comments available.  Imaging: No results found.   PMFS History: There are no active problems to display for this patient.  Past Medical History:  Diagnosis Date  . Arthritis   . Chronic pain   . Contact lens/glasses fitting    wears contacts or glasses  . Hypertension   . Hypothyroidism   . Insomnia   . Seasonal allergies   . Wears partial dentures    top and bottom partials    No family history on file.  Past Surgical History:  Procedure Laterality Date  . CHOLECYSTECTOMY    . KNEE ARTHROSCOPY Left 04/09/2007  . KNEE ARTHROSCOPY WITH MEDIAL MENISECTOMY Right 07/30/2012   Procedure: KNEE ARTHROSCOPY WITH MEDIAL AND LATERAL MENISECTOMIES, CHONDROPLASTY OF PATELLA;  Surgeon: Loreta Ave, MD;  Location: [facility];  Service: Orthopedics;  Laterality: Right;  . SHOULDER ARTHROSCOPY  8/13   right  . TOTAL KNEE ARTHROPLASTY Left 05/11/2008  . TOTAL SHOULDER ARTHROPLASTY  07/25/2011   Procedure: TOTAL SHOULDER ARTHROPLASTY;  Surgeon: Loreta Ave, MD;  Location: [facility];  Service: Orthopedics;  Laterality: Left;   Social History   Occupational History  . Not on file  Tobacco Use  . Smoking status: Never Smoker  . Smokeless tobacco: Never Used  Substance and Sexual Activity  . Alcohol use: Yes  . Drug use: No  . Sexual activity: Not on file

## 2018-07-21 ENCOUNTER — Other Ambulatory Visit: Payer: Self-pay

## 2018-07-21 ENCOUNTER — Ambulatory Visit (INDEPENDENT_AMBULATORY_CARE_PROVIDER_SITE_OTHER): Payer: BC Managed Care – PPO | Admitting: Orthopaedic Surgery

## 2018-07-21 ENCOUNTER — Encounter: Payer: Self-pay | Admitting: Orthopaedic Surgery

## 2018-07-21 VITALS — Ht 63.0 in | Wt 203.0 lb

## 2018-07-21 DIAGNOSIS — Z96652 Presence of left artificial knee joint: Secondary | ICD-10-CM | POA: Diagnosis not present

## 2018-07-21 DIAGNOSIS — M1711 Unilateral primary osteoarthritis, right knee: Secondary | ICD-10-CM | POA: Diagnosis not present

## 2018-07-21 MED ORDER — METHYLPREDNISOLONE ACETATE 40 MG/ML IJ SUSP
40.0000 mg | INTRAMUSCULAR | Status: AC | PRN
  Administered 2018-07-21: 40 mg via INTRA_ARTICULAR

## 2018-07-21 MED ORDER — LIDOCAINE HCL 1 % IJ SOLN
0.5000 mL | INTRAMUSCULAR | Status: AC | PRN
  Administered 2018-07-21: .5 mL

## 2018-07-21 MED ORDER — BUPIVACAINE HCL 0.25 % IJ SOLN
4.0000 mL | INTRAMUSCULAR | Status: AC | PRN
  Administered 2018-07-21: 4 mL via INTRA_ARTICULAR

## 2018-07-21 NOTE — Progress Notes (Signed)
Office Visit Note   Patient: Mary Harvey           Date of Birth: 22-Jun-1955           MRN: [id number] Visit Date: 07/21/2018              Requested by: Renee Pain, [place] [address] PCP: Renee Pain, FNP   Assessment & Plan: Visit Diagnoses:  1. History of total knee arthroplasty, left   2. Unilateral primary osteoarthritis, right knee   3.     Left quad weakness, improved.   Plan: Repeat injection performed right knee.  We will check her back in 3 months.  Should continue to work on weight loss and continue to work on left quad strengthening.  Follow-Up Instructions: Return in about 3 months (around 10/21/2018).   Orders:  Orders Placed This Encounter  Procedures  . Large Joint Inj   No orders of the defined types were placed in this encounter.     Procedures: Large Joint Inj: R knee on 07/21/2018 1:31 PM Indications: pain and joint swelling Details: 22 G 1.5 in needle, anterolateral approach  Arthrogram: No  Medications: 40 mg methylPREDNISolone acetate 40 MG/ML; 0.5 mL lidocaine 1 %; 4 mL bupivacaine 0.25 % Outcome: tolerated well, no immediate complications Procedure, treatment alternatives, risks and benefits explained, specific risks discussed. Consent was given by the patient. Immediately prior to procedure a time out was called to verify the correct patient, procedure, equipment, support staff and site/side marked as required. Patient was prepped and draped in the usual sterile fashion.       Clinical Data: No additional findings.   Subjective: Chief Complaint  Patient presents with  . Right Knee - Follow-up    HPI 63 year old female returns with progressive right knee osteoarthritis and left quad weakness post total knee arthroplasty 10 years ago.  Patient states she has been doing exercises I instructed her she is got improvement in her painful left total knee arthroplasty and now can walk up stairs  without using the handrail.  Pain is less she is gotten the point where she is walked as far as 3 miles.  She is trying to work on losing weight.  Right knee got good relief with the intra-articular injection she was requesting a repeat injection today.  She is thinking about total knee arthroplasty in the fall possibly.  Review of Systems posterior prediabetes, hypertension.  She is on thyroid supplement.  Previous left total knee arthroplasty 2010.  14 point review of systems otherwise negative is obtains to HPI.   Objective: Vital Signs: Ht 5\' 3"  (1.6 m)   Wt 203 lb (92.1 kg)   BMI 35.96 kg/m   Physical Exam Constitutional:      Appearance: She is well-developed.  HENT:     Head: Normocephalic.     Right Ear: External ear normal.     Left Ear: External ear normal.  Eyes:     Pupils: Pupils are equal, round, and reactive to light.  Neck:     Thyroid: No thyromegaly.     Trachea: No tracheal deviation.  Cardiovascular:     Rate and Rhythm: Normal rate.  Pulmonary:     Effort: Pulmonary effort is normal.  Abdominal:     Palpations: Abdomen is soft.  Skin:    General: Skin is warm and dry.  Neurological:     Mental Status: She is alert and oriented to person, place,  and time.  Psychiatric:        Behavior: Behavior normal.     Ortho Exam patient has 2+ effusion right knee crepitus.  Left knee takes better quad resistance with hand resistance testing with the knee extended.  She is able to go up on a step without hopping on the left knee now but has trouble going very slowly.  Specialty Comments:  No specialty comments available.  Imaging: No results found.   PMFS History: Patient Active Problem List   Diagnosis Date Noted  . History of total knee arthroplasty, left 07/21/2018  . Unilateral primary osteoarthritis, right knee 07/21/2018   Past Medical History:  Diagnosis Date  . Arthritis   . Chronic pain   . Contact lens/glasses fitting    wears contacts or  glasses  . Hypertension   . Hypothyroidism   . Insomnia   . Seasonal allergies   . Wears partial dentures    top and bottom partials    No family history on file.  Past Surgical History:  Procedure Laterality Date  . CHOLECYSTECTOMY    . KNEE ARTHROSCOPY Left 04/09/2007  . KNEE ARTHROSCOPY WITH MEDIAL MENISECTOMY Right 07/30/2012   Procedure: KNEE ARTHROSCOPY WITH MEDIAL AND LATERAL MENISECTOMIES, CHONDROPLASTY OF PATELLA;  Surgeon: Loreta Aveaniel F Murphy, MD;  Location: [facility];  Service: Orthopedics;  Laterality: Right;  . SHOULDER ARTHROSCOPY  8/13   right  . TOTAL KNEE ARTHROPLASTY Left 05/11/2008  . TOTAL SHOULDER ARTHROPLASTY  07/25/2011   Procedure: TOTAL SHOULDER ARTHROPLASTY;  Surgeon: Loreta Aveaniel F Murphy, MD;  Location: [facility];  Service: Orthopedics;  Laterality: Left;   Social History   Occupational History  . Not on file  Tobacco Use  . Smoking status: Never Smoker  . Smokeless tobacco: Never Used  Substance and Sexual Activity  . Alcohol use: Yes  . Drug use: No  . Sexual activity: Not on file

## 2018-08-19 ENCOUNTER — Other Ambulatory Visit: Payer: Self-pay | Admitting: Endocrinology

## 2018-08-19 DIAGNOSIS — E049 Nontoxic goiter, unspecified: Secondary | ICD-10-CM

## 2018-08-21 ENCOUNTER — Ambulatory Visit
Admission: RE | Admit: 2018-08-21 | Discharge: 2018-08-21 | Disposition: A | Discharge status: Home / Self Care | Payer: BC Managed Care – PPO | Source: Ambulatory Visit | Attending: Endocrinology | Admitting: Endocrinology

## 2018-08-21 DIAGNOSIS — E049 Nontoxic goiter, unspecified: Secondary | ICD-10-CM

## 2018-08-26 ENCOUNTER — Other Ambulatory Visit: Payer: BC Managed Care – PPO

## 2018-10-07 ENCOUNTER — Ambulatory Visit: Payer: BC Managed Care – PPO | Admitting: Neurology

## 2018-10-21 ENCOUNTER — Encounter: Payer: Self-pay | Admitting: Orthopaedic Surgery

## 2018-10-21 ENCOUNTER — Ambulatory Visit (INDEPENDENT_AMBULATORY_CARE_PROVIDER_SITE_OTHER): Payer: BC Managed Care – PPO | Admitting: Orthopaedic Surgery

## 2018-10-21 VITALS — BP 133/82 | HR 72 | Ht 64.0 in | Wt 209.0 lb

## 2018-10-21 DIAGNOSIS — M1711 Unilateral primary osteoarthritis, right knee: Secondary | ICD-10-CM

## 2018-10-21 DIAGNOSIS — Z96652 Presence of left artificial knee joint: Secondary | ICD-10-CM

## 2018-10-21 MED ORDER — BUPIVACAINE HCL 0.25 % IJ SOLN
4.0000 mL | INTRAMUSCULAR | Status: AC | PRN
  Administered 2018-10-21: 4 mL via INTRA_ARTICULAR

## 2018-10-21 MED ORDER — METHYLPREDNISOLONE ACETATE 40 MG/ML IJ SUSP
40.0000 mg | INTRAMUSCULAR | Status: AC | PRN
  Administered 2018-10-21: 40 mg via INTRA_ARTICULAR

## 2018-10-21 MED ORDER — LIDOCAINE HCL 1 % IJ SOLN
0.5000 mL | INTRAMUSCULAR | Status: AC | PRN
  Administered 2018-10-21: .5 mL

## 2018-10-21 NOTE — Progress Notes (Signed)
Office Visit Note   Patient: Mary Harvey           Date of Birth: [birth year]           MRN: [id number] Visit Date: 10/21/2018              Requested by: Hezzie BumpPlunk, Tiffany Quinn, FNP [address] [place],  [place] [id number] PCP: Hezzie BumpPlunk, Tiffany Quinn, FNP   Assessment & Plan: Visit Diagnoses:  1. Unilateral primary osteoarthritis, right knee   2. History of total knee arthroplasty, left     Plan: Right knee injection performed which she tolerated well.  She will return in 2 months to discuss planned total knee arthroplasty on the right in the month of December at her request.  Follow-Up Instructions: Return in about 2 months (around 12/21/2018).   Orders:  Orders Placed This Encounter  Procedures  . Large Joint Inj   No orders of the defined types were placed in this encounter.     Procedures: Large Joint Inj: R knee on 10/21/2018 9:32 AM Indications: pain and joint swelling Details: 22 G 1.5 in needle, anterolateral approach  Arthrogram: No  Medications: 40 mg methylPREDNISolone acetate 40 MG/ML; 0.5 mL lidocaine 1 %; 4 mL bupivacaine 0.25 % Outcome: tolerated well, no immediate complications Procedure, treatment alternatives, risks and benefits explained, specific risks discussed. Consent was given by the patient. Immediately prior to procedure a time out was called to verify the correct patient, procedure, equipment, support staff and site/side marked as required. Patient was prepped and draped in the usual sterile fashion.       Clinical Data: No additional findings.   Subjective: Chief Complaint  Patient presents with  . Right Knee - Follow-up, Pain    HPI 63 year old female returns for follow-up right knee osteoarthritis.  Previous injection 3 months ago with relief released 2 months.  She states she wants to schedule her right total knee in December of this year.  She has some concerns about COVID as well as would like to have it when she is in the house  more during the wintertime.  Previous left total knee arthroplasty done by Dr. Richardson Landryan Murphy several years ago continues to do well.  Patient takes anti-inflammatories Duexis with some improvement she uses intermittent ice.  She is requesting a repeat right knee injection today.  Review of Systems 14 point system update unchanged from 07/21/2018 other than as mentioned HPI.  Left total knee 2010.   Objective: Vital Signs: BP 133/82   Pulse 72   Ht 5\' 4"  (1.626 m)   Wt 209 lb (94.8 kg)   BMI 35.87 kg/m   Physical Exam Constitutional:      Appearance: She is well-developed.  HENT:     Head: Normocephalic.     Right Ear: External ear normal.     Left Ear: External ear normal.  Eyes:     Pupils: Pupils are equal, round, and reactive to light.  Neck:     Thyroid: No thyromegaly.     Trachea: No tracheal deviation.  Cardiovascular:     Rate and Rhythm: Normal rate.  Pulmonary:     Effort: Pulmonary effort is normal.  Abdominal:     Palpations: Abdomen is soft.  Skin:    General: Skin is warm and dry.  Neurological:     Mental Status: She is alert and oriented to person, place, and time.  Psychiatric:        Behavior: Behavior normal.  Ortho Exam well-healed midline left total knee arthroplasty incision right knee has crepitus range of motion 2+ effusion.  She is amatory with a knee limp negative logroll to the hips distal pulses are intact.  Specialty Comments:  No specialty comments available.  Imaging: No results found.   PMFS History: Patient Active Problem List   Diagnosis Date Noted  . History of total knee arthroplasty, left 07/21/2018  . Unilateral primary osteoarthritis, right knee 07/21/2018   Past Medical History:  Diagnosis Date  . Arthritis   . Chronic pain   . Contact lens/glasses fitting    wears contacts or glasses  . Hypertension   . Hypothyroidism   . Insomnia   . Seasonal allergies   . Wears partial dentures    top and bottom partials     No family history on file.  Past Surgical History:  Procedure Laterality Date  . CHOLECYSTECTOMY    . KNEE ARTHROSCOPY Left 04/09/2007  . KNEE ARTHROSCOPY WITH MEDIAL MENISECTOMY Right 07/30/2012   Procedure: KNEE ARTHROSCOPY WITH MEDIAL AND LATERAL MENISECTOMIES, CHONDROPLASTY OF PATELLA;  Surgeon: Ninetta Lights, MD;  Location: [facility];  Service: Orthopedics;  Laterality: Right;  . SHOULDER ARTHROSCOPY  8/13   right  . TOTAL KNEE ARTHROPLASTY Left 05/11/2008  . TOTAL SHOULDER ARTHROPLASTY  07/25/2011   Procedure: TOTAL SHOULDER ARTHROPLASTY;  Surgeon: Ninetta Lights, MD;  Location: [facility];  Service: Orthopedics;  Laterality: Left;   Social History   Occupational History  . Not on file  Tobacco Use  . Smoking status: Never Smoker  . Smokeless tobacco: Never Used  Substance and Sexual Activity  . Alcohol use: Yes  . Drug use: No  . Sexual activity: Not on file

## 2018-10-26 ENCOUNTER — Ambulatory Visit: Payer: BC Managed Care – PPO | Admitting: Neurology

## 2018-11-05 ENCOUNTER — Other Ambulatory Visit (INDEPENDENT_AMBULATORY_CARE_PROVIDER_SITE_OTHER): Payer: Self-pay | Admitting: Family Medicine

## 2018-12-22 ENCOUNTER — Ambulatory Visit: Payer: BC Managed Care – PPO | Admitting: Orthopaedic Surgery

## 2018-12-22 ENCOUNTER — Encounter: Payer: Self-pay | Admitting: Orthopaedic Surgery

## 2018-12-22 ENCOUNTER — Other Ambulatory Visit: Payer: Self-pay

## 2018-12-22 VITALS — BP 127/83 | HR 77 | Ht 64.0 in | Wt 203.0 lb

## 2018-12-22 DIAGNOSIS — M1711 Unilateral primary osteoarthritis, right knee: Secondary | ICD-10-CM | POA: Diagnosis not present

## 2018-12-22 DIAGNOSIS — M542 Cervicalgia: Secondary | ICD-10-CM

## 2018-12-22 DIAGNOSIS — Z96652 Presence of left artificial knee joint: Secondary | ICD-10-CM | POA: Diagnosis not present

## 2018-12-22 DIAGNOSIS — G8929 Other chronic pain: Secondary | ICD-10-CM | POA: Diagnosis not present

## 2018-12-22 MED ORDER — BUPIVACAINE HCL 0.25 % IJ SOLN
4.0000 mL | INTRAMUSCULAR | Status: AC | PRN
Start: 2018-12-22 — End: 2018-12-22
  Administered 2018-12-22: 4 mL via INTRA_ARTICULAR

## 2018-12-22 MED ORDER — LIDOCAINE HCL 1 % IJ SOLN
0.5000 mL | INTRAMUSCULAR | Status: AC | PRN
  Administered 2018-12-22: .5 mL

## 2018-12-22 MED ORDER — DUEXIS 800-26.6 MG PO TABS: 1.0000 | ORAL_TABLET | Freq: Three times a day (TID) | ORAL | 3 refills | Status: DC | PRN

## 2018-12-22 MED ORDER — METHYLPREDNISOLONE ACETATE 40 MG/ML IJ SUSP
40.0000 mg | INTRAMUSCULAR | Status: AC | PRN
  Administered 2018-12-22: 40 mg via INTRA_ARTICULAR

## 2018-12-22 NOTE — Progress Notes (Signed)
Office Visit Note   Patient: Mary Harvey           Date of Birth: 1955/02/23           MRN: [id number] Visit Date: 12/22/2018              Requested by: Mary Harvey, [place] [address] PCP: Mary Pain, FNP   Assessment & Plan: Visit Diagnoses:  1. Unilateral primary osteoarthritis, right knee   2. History of total knee arthroplasty, left   3. Chronic neck Harvey     Plan: Repeat knee injection performed at patient's request which she tolerated well right knee.  Injection prior to this was done in September.  She will return in 1 month we will repeat standing AP x-rays both knee and lateral right knee.  She also requested AP lateral cervical spine x-rays on return in December.  Patient states her current plan is total knee arthroplasty of the right knee sometime in February.  She can return in December obtain x-rays as above and we can discuss this further.  Follow-Up Instructions: Return in about 1 month (around 01/21/2019).   Orders:  Orders Placed This Encounter  Procedures   Large Joint Inj: R knee   Meds ordered this encounter  Medications   Ibuprofen-Famotidine (DUEXIS) 800-26.6 MG TABS    Sig: Take 1 tablet by mouth 3 (three) times daily as needed.    Dispense:  90 tablet    Refill:  3      Procedures: Large Joint Inj: R knee on 12/22/2018 11:13 AM Indications: Harvey and joint swelling Details: 22 G 1.5 in needle, anterolateral approach  Arthrogram: No  Medications: 40 mg methylPREDNISolone acetate 40 MG/ML; 0.5 mL lidocaine 1 %; 4 mL bupivacaine 0.25 % Outcome: tolerated well, no immediate complications Procedure, treatment alternatives, risks and benefits explained, specific risks discussed. Consent was given by the patient. Immediately prior to procedure a time out was called to verify the correct patient, procedure, equipment, support staff and site/side marked as required. Patient was prepped and draped in the  usual sterile fashion.       Clinical Data: No additional findings.   Subjective: Chief Complaint  Patient presents with   Right Knee - Harvey, Follow-up    HPI 63 year old female returns with persistent increasing problems with right knee Harvey catching, swelling with activities and limping.  Patient states she is ready to consider total knee arthroplasty but is thinking she like to wait till after the holidays.  She is also had persistent problems with her neck had previous epidurals in the past and has not had any recent imaging studies.  She has neck Harvey with rotation of her neck tilting some Harvey that radiates into her arms but not constant.  Knee arthroplasty on the left done by Dr. Percell Harvey years ago continues to do well with only occasional mild discomfort.  Review of Systems 14 point systems positive for increased BMI greater than 30, total knee arthroplasty left.  Right primary knee osteoarthritis.  Cervical spondylosis with chronic neck Harvey.  Otherwise negative as pertains to HPI.   Objective: Vital Signs: BP 127/83    Pulse 77    Ht 5\' 4"  (1.626 m)    Wt 203 lb (92.1 kg)    BMI 34.84 kg/m   Physical Exam Constitutional:      Appearance: She is well-developed.  HENT:     Head: Normocephalic.     Right  Ear: External ear normal.     Left Ear: External ear normal.  Eyes:     Pupils: Pupils are equal, round, and reactive to light.  Neck:     Thyroid: No thyromegaly.     Trachea: No tracheal deviation.  Cardiovascular:     Rate and Rhythm: Normal rate.  Pulmonary:     Effort: Pulmonary effort is normal.  Abdominal:     Palpations: Abdomen is soft.  Skin:    General: Skin is warm and dry.  Neurological:     Mental Status: She is alert and oriented to person, place, and time.  Psychiatric:        Behavior: Behavior normal.     Ortho Exam patient has good hip range of motion.  Right knee range of motion with crepitus.  She has both medial lateral joint line Harvey.   2+ knee effusion collateral crucial ligament exam is normal.  Distal pulses are 2+ anterior tib gastrocsoleus is strong.  She is amatory with a mild right knee limp today.  Specialty Comments:  No specialty comments available.  Imaging: No results found.   PMFS History: Patient Active Problem List   Diagnosis Date Noted   Chronic neck Harvey 12/22/2018   History of total knee arthroplasty, left 07/21/2018   Unilateral primary osteoarthritis, right knee 07/21/2018   Past Medical History:  Diagnosis Date   Arthritis    Chronic Harvey    Contact lens/glasses fitting    wears contacts or glasses   Hypertension    Hypothyroidism    Insomnia    Seasonal allergies    Wears partial dentures    top and bottom partials    No family history on file.  Past Surgical History:  Procedure Laterality Date   CHOLECYSTECTOMY     KNEE ARTHROSCOPY Left 04/09/2007   KNEE ARTHROSCOPY WITH MEDIAL MENISECTOMY Right 07/30/2012   Procedure: KNEE ARTHROSCOPY WITH MEDIAL AND LATERAL MENISECTOMIES, CHONDROPLASTY OF PATELLA;  Surgeon: Mary Ave, MD;  Location: [facility];  Service: Orthopedics;  Laterality: Right;   SHOULDER ARTHROSCOPY  8/13   right   TOTAL KNEE ARTHROPLASTY Left 05/11/2008   TOTAL SHOULDER ARTHROPLASTY  07/25/2011   Procedure: TOTAL SHOULDER ARTHROPLASTY;  Surgeon: Mary Ave, MD;  Location: [facility];  Service: Orthopedics;  Laterality: Left;   Social History   Occupational History   Not on file  Tobacco Use   Smoking status: Never Smoker   Smokeless tobacco: Never Used  Substance and Sexual Activity   Alcohol use: Yes   Drug use: No   Sexual activity: Not on file

## 2019-01-13 ENCOUNTER — Other Ambulatory Visit: Payer: Self-pay

## 2019-01-13 ENCOUNTER — Encounter: Payer: Self-pay | Admitting: Orthopaedic Surgery

## 2019-01-13 ENCOUNTER — Ambulatory Visit: Payer: BC Managed Care – PPO | Admitting: Orthopaedic Surgery

## 2019-01-13 ENCOUNTER — Ambulatory Visit: Payer: Self-pay

## 2019-01-13 VITALS — BP 110/73 | HR 68 | Ht 64.0 in | Wt 198.0 lb

## 2019-01-13 DIAGNOSIS — M542 Cervicalgia: Secondary | ICD-10-CM

## 2019-01-13 DIAGNOSIS — Z96652 Presence of left artificial knee joint: Secondary | ICD-10-CM

## 2019-01-13 DIAGNOSIS — M1711 Unilateral primary osteoarthritis, right knee: Secondary | ICD-10-CM | POA: Diagnosis not present

## 2019-01-13 DIAGNOSIS — M4722 Other spondylosis with radiculopathy, cervical region: Secondary | ICD-10-CM

## 2019-01-13 MED ORDER — DIAZEPAM 5 MG PO TABS: ORAL_TABLET | ORAL | 0 refills | Status: DC

## 2019-01-13 NOTE — Progress Notes (Signed)
Office Visit Note   Patient: Mary Harvey           Date of Birth: 05/03/1955           MRN: [id number] Visit Date: 01/13/2019              Requested by: Renee Pain, [place] [address] PCP: Renee Pain, FNP   Assessment & Plan: Visit Diagnoses:  1. Unilateral primary osteoarthritis, right knee   2. Neck pain   3. Other spondylosis with radiculopathy, cervical region   4. History of total knee arthroplasty, left     Plan: Patient's knee is doing better post injection she states her neck pain is her principal problem is problem for her sleeping is gradually getting worse she has had cervical epidurals in the past but no imaging studies in the last 10 years.  Pain wakes her up at night she has numbness radiates to her hands and positive tension signs.  She has been treated with anti-inflammatories muscle relaxants and has had symptoms greater than 6 months.  She has been through conservative treatment program 3 months and we will proceed with cervical MRI scan for evaluation of her cervical spondylosis.  Office follow-up after scan for review.  Follow-Up Instructions: No follow-ups on file.   Orders:  Orders Placed This Encounter  Procedures   XR Knee 1-2 Views Right   XR Cervical Spine 2 or 3 views   No orders of the defined types were placed in this encounter.     Procedures: No procedures performed   Clinical Data: No additional findings.   Subjective: Chief Complaint  Patient presents with   Neck - Pain, Follow-up   Right Knee - Pain, Follow-up    HPI 63 year old female returns states injection her knee on 12/22/2018 gave her good relief.  She occasionally still has catching when she gets up and moves and turns her knee in a certain way.  She continues to have ongoing pain in her neck that radiates from her neck into her shoulders.  She has numbness that radiates down to her hands right equal to the left but more  pain in her left shoulder than the right shoulder.  She has had shoulder arthroplasty on the left but states the pain is not reproduced with shoulder range of motion.  She has limitation of neck flexion extension but no myelopathic symptoms.  She has problems sleeping and uses a special neck pillow.  She is taken anti-inflammatories.  In the past more than a year ago she has had cervical epidural injections.  No recent cervical spine x-rays.  She denies fever or chills.  Patient to use anti-inflammatories as well as muscle relaxants for her neck with some improvement.  Review of Systems systems positive for left on the arthroplasty.  Right knee osteoarthritis improved with injection.  Chronic cervical spondylosis.  Previous left shoulder arthroplasty.  Positive for seasonal allergies.  Positive for hypertension.,  Diabetes on oral medications.   Objective: Vital Signs: BP 110/73    Pulse 68    Ht 5\' 4"  (1.626 m)    Wt 198 lb (89.8 kg)    BMI 33.99 kg/m   Physical Exam Constitutional:      Appearance: She is well-developed.  HENT:     Head: Normocephalic.     Right Ear: External ear normal.     Left Ear: External ear normal.  Eyes:     Pupils: Pupils  are equal, round, and reactive to light.  Neck:     Thyroid: No thyromegaly.     Trachea: No tracheal deviation.  Cardiovascular:     Rate and Rhythm: Normal rate.  Pulmonary:     Effort: Pulmonary effort is normal.  Abdominal:     Palpations: Abdomen is soft.  Skin:    General: Skin is warm and dry.  Neurological:     Mental Status: She is alert and oriented to person, place, and time.  Psychiatric:        Behavior: Behavior normal.     Ortho Exam patient has brachial plexus tenderness more left than right pain with cervical compression no change with distraction upper semireflexes are 2+ and symmetrical.  No thenar or hypothenar atrophy good grip strength.  Normal gait without lower extremity hyperreflexia no clonus.  Positive  Spurling left greater than right.  No triceps biceps wrist flexion extension or finger flexor or extensor weakness.  Cervical rotation is 75% right and left.  Pain with forward flexion chin 3 fingerbreadths chin to chest and pain with cervical extension.  Specialty Comments:  No specialty comments available.  Imaging: No results found.   PMFS History: Patient Active Problem List   Diagnosis Date Noted   Other spondylosis with radiculopathy, cervical region 01/13/2019   Chronic neck pain 12/22/2018   History of total knee arthroplasty, left 07/21/2018   Unilateral primary osteoarthritis, right knee 07/21/2018   Past Medical History:  Diagnosis Date   Arthritis    Chronic pain    Contact lens/glasses fitting    wears contacts or glasses   Hypertension    Hypothyroidism    Insomnia    Seasonal allergies    Wears partial dentures    top and bottom partials    No family history on file.  Past Surgical History:  Procedure Laterality Date   CHOLECYSTECTOMY     KNEE ARTHROSCOPY Left 04/09/2007   KNEE ARTHROSCOPY WITH MEDIAL MENISECTOMY Right 07/30/2012   Procedure: KNEE ARTHROSCOPY WITH MEDIAL AND LATERAL MENISECTOMIES, CHONDROPLASTY OF PATELLA;  Surgeon: Loreta Ave, MD;  Location: [facility];  Service: Orthopedics;  Laterality: Right;   SHOULDER ARTHROSCOPY  8/13   right   TOTAL KNEE ARTHROPLASTY Left 05/11/2008   TOTAL SHOULDER ARTHROPLASTY  07/25/2011   Procedure: TOTAL SHOULDER ARTHROPLASTY;  Surgeon: Loreta Ave, MD;  Location: [facility];  Service: Orthopedics;  Laterality: Left;   Social History   Occupational History   Not on file  Tobacco Use   Smoking status: Never Smoker   Smokeless tobacco: Never Used  Substance and Sexual Activity   Alcohol use: Yes   Drug use: No   Sexual activity: Not on file

## 2019-01-14 ENCOUNTER — Encounter: Payer: Self-pay | Admitting: Orthopaedic Surgery

## 2019-02-06 ENCOUNTER — Other Ambulatory Visit: Payer: Self-pay

## 2019-02-06 ENCOUNTER — Ambulatory Visit
Admission: RE | Admit: 2019-02-06 | Discharge: 2019-02-06 | Disposition: A | Discharge status: Home / Self Care | Payer: BC Managed Care – PPO | Source: Ambulatory Visit | Attending: Orthopaedic Surgery | Admitting: Orthopaedic Surgery

## 2019-02-06 DIAGNOSIS — M4722 Other spondylosis with radiculopathy, cervical region: Secondary | ICD-10-CM

## 2019-02-09 ENCOUNTER — Ambulatory Visit: Payer: BC Managed Care – PPO | Admitting: Orthopaedic Surgery

## 2019-02-16 ENCOUNTER — Encounter: Payer: Self-pay | Admitting: Orthopaedic Surgery

## 2019-02-16 ENCOUNTER — Other Ambulatory Visit: Payer: Self-pay

## 2019-02-16 ENCOUNTER — Ambulatory Visit: Payer: BC Managed Care – PPO | Admitting: Orthopaedic Surgery

## 2019-02-16 VITALS — Ht 64.0 in | Wt 198.0 lb

## 2019-02-16 DIAGNOSIS — M4722 Other spondylosis with radiculopathy, cervical region: Secondary | ICD-10-CM

## 2019-02-16 NOTE — Progress Notes (Signed)
Office Visit Note   Patient: Mary Harvey           Date of Birth: Jun 16, 1955           MRN: [id number] Visit Date: 02/16/2019              Requested by: Hezzie Bump, FNP [address] [place],  [place] [address] PCP: Hezzie Bump, FNP   Assessment & Plan: Visit Diagnoses:  1. Other spondylosis with radiculopathy, cervical region     Plan: We reviewed the MRI scan images and gave her a copy of the report.  She has some significant spondylosis with disc bulging endplate spurring effacing the ventral cord and right hemicord with mass-effect at C5-6 and then also at C4-5 and C5-6-7.  She has moderate right foraminal stenosis at C7.  No cord signal abnormality.  We discussed options with her which would be a multilevel anterior fusion.  Currently she is doing better on the Duexis and I plan to recheck her in 3 months.  Exam shows no myelopathy and she did not have any cord abnormal signal on MRI scan.  Follow-Up Instructions: Return in about 1 month (around 03/19/2019).   Orders:  No orders of the defined types were placed in this encounter.  No orders of the defined types were placed in this encounter.     Procedures: No procedures performed   Clinical Data: No additional findings.   Subjective: Chief Complaint  Patient presents with  . Neck - Pain    MRI Review    HPI 64 year old female returns for follow-up of cervical spondylosis post MRI scan cervical spine.  She has had some numbness in her hand more on the right than left.  Previous left total knee arthroplasty doing well.  She is having progressive right knee arthritis symptoms.  She has been taking Duexis and states this is actually giving her some fairly good relief of her neck symptoms.  Review of Systems 14 point update unchanged from 04/17/2018 + for prediabetes.  Total knee arthroplasty Dr. Richardson Landry 2014.  Positive for thyroid supplementation hypertension controlled on medication otherwise  negative as pertains to HPI.   Objective: Vital Signs: Ht 5\' 4"  (1.626 m)   Wt 198 lb (89.8 kg)   BMI 33.99 kg/m   Physical Exam Constitutional:      Appearance: She is well-developed.  HENT:     Head: Normocephalic.     Right Ear: External ear normal.     Left Ear: External ear normal.  Eyes:     Pupils: Pupils are equal, round, and reactive to light.  Neck:     Thyroid: No thyromegaly.     Trachea: No tracheal deviation.  Cardiovascular:     Rate and Rhythm: Normal rate.  Pulmonary:     Effort: Pulmonary effort is normal.  Abdominal:     Palpations: Abdomen is soft.  Skin:    General: Skin is warm and dry.  Neurological:     Mental Status: She is alert and oriented to person, place, and time.  Psychiatric:        Behavior: Behavior normal.     Ortho Exam patient has a brachial plexus tenderness slightly more on the left than right side.  No tenderness with the cervical compression or distraction reflexes are symmetrical no isolated motor weakness.  Normal gait without clonus lower extremities.  She still has some discomfort with rotation of the neck right and left which is about  75% normal rotation.  Specialty Comments:  No specialty comments available.  Imaging: CLINICAL DATA:  64 year old female with neck pain radiating to both shoulders and arms with bilateral hand numbness and cramping. Progressive chronic symptoms. No known injury.  EXAM: MRI CERVICAL SPINE WITHOUT CONTRAST  TECHNIQUE: Multiplanar, multisequence MR imaging of the cervical spine was performed. No intravenous contrast was administered.  COMPARISON:  Cervical spine radiographs 01/13/2019. [facility] cervical MRI 05/02/2011.  FINDINGS: Alignment: Straightening of cervical lordosis has not significantly changed since 2013. No spondylolisthesis.  Vertebrae: No marrow edema or evidence of acute osseous abnormality. Chronic degenerative endplate marrow signal  changes from C4 inferiorly. Background bone marrow signal within normal limits.  Cord: Spinal cord signal is within normal limits at all visualized levels. Fairly capacious spinal canal.  Posterior Fossa, vertebral arteries, paraspinal tissues: Cervicomedullary junction is within normal limits. Negative visible posterior fossa. Prominent partially empty sella (series 4, image 8), better visualized than in 2013 but appears to be chronic.  Stable major vascular flow voids in the neck. The right vertebral artery appears mildly dominant. Negative visible neck soft tissues and lung apices.  Disc levels:  C2-C3:  Negative.  C3-C4:  Negative aside from mild left facet hypertrophy.  C4-C5: Disc space loss with posterior mildly lobulated disc protrusions, chronic but increased since 2013. Effaced ventral CSF space with mild new ventral cord mass effect although the dorsal CSF space remains patent (series 4, image 18). Superimposed circumferential disc bulge and endplate spurring. Mild right C5 foraminal stenosis appears stable.  C5-C6: Disc space loss with chronic broad-based right paracentral disc protrusion. Superimposed circumferential disc bulge and endplate spurring. Chronic effaced ventral cord and mild right hemi cord mass effect appear not significantly changed. Dorsal CSF space remains patent. Mild to moderate right C6 foraminal stenosis appears stable.  C6-C7: Disc space loss with chronic but mildly increased broad-based central and slightly caudal disc extrusion (series 7, image 19). Ventral cord is effaced but there is no significant spinal stenosis. Mild to moderate right C7 foraminal stenosis does appear increased, in part related to right far lateral disc osteophyte complex.  C7-T1:  Mild facet hypertrophy. No stenosis.  Mild upper thoracic facet hypertrophy and disc bulging appears stable.  IMPRESSION: 1. Chronic cervical disc and endplate  degeneration from C4-C5 to C6-C7 has increased since 2013 and does efface the ventral spinal cord, but the cervical canal is capacious and there is no significant spinal stenosis. Up to mild associated ventral cord mass effect but no cord signal abnormality.  2. Up to moderate multifactorial right C7 neural foraminal stenosis does appear increased since 2013. But mild to moderate right C5 and C6 foraminal stenosis is stable.   Electronically Signed   By: Odessa Fleming M.D.   On: 02/06/2019 09:22    PMFS History: Patient Active Problem List   Diagnosis Date Noted  . Other spondylosis with radiculopathy, cervical region 01/13/2019  . Chronic neck pain 12/22/2018  . History of total knee arthroplasty, left 07/21/2018  . Unilateral primary osteoarthritis, right knee 07/21/2018   Past Medical History:  Diagnosis Date  . Arthritis   . Chronic pain   . Contact lens/glasses fitting    wears contacts or glasses  . Hypertension   . Hypothyroidism   . Insomnia   . Seasonal allergies   . Wears partial dentures    top and bottom partials    No family history on file.  Past Surgical History:  Procedure Laterality Date  .  CHOLECYSTECTOMY    . KNEE ARTHROSCOPY Left 04/09/2007  . KNEE ARTHROSCOPY WITH MEDIAL MENISECTOMY Right 07/30/2012   Procedure: KNEE ARTHROSCOPY WITH MEDIAL AND LATERAL MENISECTOMIES, CHONDROPLASTY OF PATELLA;  Surgeon: Ninetta Lights, MD;  Location: [facility];  Service: Orthopedics;  Laterality: Right;  . SHOULDER ARTHROSCOPY  8/13   right  . TOTAL KNEE ARTHROPLASTY Left 05/11/2008  . TOTAL SHOULDER ARTHROPLASTY  07/25/2011   Procedure: TOTAL SHOULDER ARTHROPLASTY;  Surgeon: Ninetta Lights, MD;  Location: [facility];  Service: Orthopedics;  Laterality: Left;   Social History   Occupational History  . Not on file  Tobacco Use  . Smoking status: Never Smoker  . Smokeless tobacco: Never Used  Substance and Sexual Activity  .  Alcohol use: Yes  . Drug use: No  . Sexual activity: Not on file

## 2019-02-19 ENCOUNTER — Encounter: Payer: Self-pay | Admitting: Orthopaedic Surgery

## 2019-03-19 ENCOUNTER — Other Ambulatory Visit (INDEPENDENT_AMBULATORY_CARE_PROVIDER_SITE_OTHER): Payer: Self-pay | Admitting: Family Medicine

## 2019-03-22 ENCOUNTER — Ambulatory Visit: Payer: BC Managed Care – PPO | Admitting: Orthopaedic Surgery

## 2019-03-22 ENCOUNTER — Encounter: Payer: Self-pay | Admitting: Orthopaedic Surgery

## 2019-03-22 ENCOUNTER — Other Ambulatory Visit: Payer: Self-pay

## 2019-03-22 VITALS — Ht 64.0 in | Wt 198.0 lb

## 2019-03-22 DIAGNOSIS — M4722 Other spondylosis with radiculopathy, cervical region: Secondary | ICD-10-CM

## 2019-03-22 DIAGNOSIS — M1711 Unilateral primary osteoarthritis, right knee: Secondary | ICD-10-CM

## 2019-03-22 MED ORDER — DUEXIS 800-26.6 MG PO TABS: 1.0000 | ORAL_TABLET | Freq: Three times a day (TID) | ORAL | 3 refills | Status: DC | PRN

## 2019-03-22 MED ORDER — BUPIVACAINE HCL 0.25 % IJ SOLN
4.0000 mL | INTRAMUSCULAR | Status: AC | PRN
  Administered 2019-03-22: 11:00:00 4 mL via INTRA_ARTICULAR

## 2019-03-22 MED ORDER — METHYLPREDNISOLONE ACETATE 40 MG/ML IJ SUSP
40.0000 mg | INTRAMUSCULAR | Status: AC | PRN
  Administered 2019-03-22: 40 mg via INTRA_ARTICULAR

## 2019-03-22 MED ORDER — LIDOCAINE HCL 1 % IJ SOLN
0.5000 mL | INTRAMUSCULAR | Status: AC | PRN
  Administered 2019-03-22: 11:00:00 .5 mL

## 2019-03-22 NOTE — Progress Notes (Signed)
Office Visit Note   Patient: Mary Harvey           Date of Birth: [birth year]           MRN: [id number] Visit Date: 03/22/2019              Requested by: Renee Pain, [place] [address] PCP: Renee Pain, FNP   Assessment & Plan: Visit Diagnoses: No diagnosis found.  Plan: Right knee injection performed which she tolerated well.  Duexis refill sent in.  She can return in a few months if she is having persistent problems.  We discussed the progressive osteoarthritis in the right knee and she will call when she feels like she needs to proceed with total knee arthroplasty.  She is getting ready to start a job with not as likely to bother her neck and shoulders with her cervical spondylosis.  She does have some spondylitic changes from C4-C7 as outlined in previous note.  Follow-Up Instructions: No follow-ups on file.   Orders:  No orders of the defined types were placed in this encounter.  Meds ordered this encounter  Medications  . Ibuprofen-Famotidine (DUEXIS) 800-26.6 MG TABS    Sig: Take 1 tablet by mouth 3 (three) times daily as needed.    Dispense:  90 tablet    Refill:  3      Procedures: Large Joint Inj: R knee on 03/22/2019 10:30 AM Indications: pain and joint swelling Details: 22 G 1.5 in needle, anterolateral approach  Arthrogram: No  Medications: 40 mg methylPREDNISolone acetate 40 MG/ML; 0.5 mL lidocaine 1 %; 4 mL bupivacaine 0.25 % Outcome: tolerated well, no immediate complications Procedure, treatment alternatives, risks and benefits explained, specific risks discussed. Consent was given by the patient. Immediately prior to procedure a time out was called to verify the correct patient, procedure, equipment, support staff and site/side marked as required. Patient was prepped and draped in the usual sterile fashion.       Clinical Data: No additional findings.   Subjective: Chief Complaint  Patient presents  with  . Right Knee - Pain    HPI 64 year old female returns with right knee osteoarthritis and is requesting repeat injection last was done in November.  She is also requesting a refill of her Duexis which is worked well for her.  She has a central pharmacy in [place] and states that ibuprofen and other anti-inflammatories have not worked like the [employer] works.  She takes it once or twice a day.  Previous left total knee arthroplasty done by Dr. Percell Miller in the past doing well.  Review of Systems positive for prediabetes update unchanged from 04/17/2018.  Total knee arthroplasty on the left 2014.  She does have hypertension takes thyroid supplementation 14 point systems otherwise negative is obtains HPI.   Objective: Vital Signs: Ht 5\' 4"  (1.626 m)   Wt 198 lb (89.8 kg)   BMI 33.99 kg/m   Physical Exam Constitutional:      Appearance: She is well-developed.  HENT:     Head: Normocephalic.     Right Ear: External ear normal.     Left Ear: External ear normal.  Eyes:     Pupils: Pupils are equal, round, and reactive to light.  Neck:     Thyroid: No thyromegaly.     Trachea: No tracheal deviation.  Cardiovascular:     Rate and Rhythm: Normal rate.  Pulmonary:     Effort: Pulmonary effort is  normal.  Abdominal:     Palpations: Abdomen is soft.  Skin:    General: Skin is warm and dry.  Neurological:     Mental Status: She is alert and oriented to person, place, and time.  Psychiatric:        Behavior: Behavior normal.     Ortho Exam patient has crepitus with right knee range of motion amatory with a mild knee limp.  More medial than lateral joint line tenderness.  Well-healed midline incision left knee.  No rash over exposed skin.  Crepitus with patellar loading she does reach full extension flexes past 110 degrees.  Specialty Comments:  No specialty comments available.  Imaging: No results found.   PMFS History: Patient Active Problem List   Diagnosis Date Noted  .  Other spondylosis with radiculopathy, cervical region 01/13/2019  . Chronic neck pain 12/22/2018  . History of total knee arthroplasty, left 07/21/2018  . Unilateral primary osteoarthritis, right knee 07/21/2018   Past Medical History:  Diagnosis Date  . Arthritis   . Chronic pain   . Contact lens/glasses fitting    wears contacts or glasses  . Hypertension   . Hypothyroidism   . Insomnia   . Seasonal allergies   . Wears partial dentures    top and bottom partials    No family history on file.  Past Surgical History:  Procedure Laterality Date  . CHOLECYSTECTOMY    . KNEE ARTHROSCOPY Left 04/09/2007  . KNEE ARTHROSCOPY WITH MEDIAL MENISECTOMY Right 07/30/2012   Procedure: KNEE ARTHROSCOPY WITH MEDIAL AND LATERAL MENISECTOMIES, CHONDROPLASTY OF PATELLA;  Surgeon: Loreta Ave, MD;  Location: [facility];  Service: Orthopedics;  Laterality: Right;  . SHOULDER ARTHROSCOPY  8/13   right  . TOTAL KNEE ARTHROPLASTY Left 05/11/2008  . TOTAL SHOULDER ARTHROPLASTY  07/25/2011   Procedure: TOTAL SHOULDER ARTHROPLASTY;  Surgeon: Loreta Ave, MD;  Location: [facility];  Service: Orthopedics;  Laterality: Left;   Social History   Occupational History  . Not on file  Tobacco Use  . Smoking status: Never Smoker  . Smokeless tobacco: Never Used  Substance and Sexual Activity  . Alcohol use: Yes  . Drug use: No  . Sexual activity: Not on file

## 2019-04-05 ENCOUNTER — Other Ambulatory Visit: Payer: Self-pay

## 2019-07-27 ENCOUNTER — Ambulatory Visit: Payer: BC Managed Care – PPO | Admitting: Orthopaedic Surgery

## 2019-07-27 ENCOUNTER — Encounter: Payer: Self-pay | Admitting: Orthopaedic Surgery

## 2019-07-27 VITALS — BP 142/85 | HR 83 | Ht 64.0 in | Wt 210.0 lb

## 2019-07-27 DIAGNOSIS — M25561 Pain in right knee: Secondary | ICD-10-CM | POA: Diagnosis not present

## 2019-07-27 DIAGNOSIS — G8929 Other chronic pain: Secondary | ICD-10-CM | POA: Diagnosis not present

## 2019-07-27 NOTE — Progress Notes (Signed)
Office Visit Note   Patient: Mary Harvey           Date of Birth: December 25, 1955           MRN: [id number] Visit Date: 07/27/2019              Requested by: Renee Pain, [place] [address] PCP: Renee Pain, FNP   Assessment & Plan: Visit Diagnoses:  1. Chronic pain of right knee     Plan: With patient's right knee pain and feeling mechanical symptoms I think it be reasonable to get an MRI to better evaluate the extent of her degenerative changes and also rule out large medial meniscal tear.  Medial lateral compartments do not look too bad on x-ray so advised patient that if she indeed does have a meniscal tear maybe this is something that can be addressed with outpatient arthroscopy and not knee replacement.  She will follow-up with Dr. Lorin Mercy after completion of her study to discuss results along with nonsurgical versus surgical options.  Follow-Up Instructions: Return in about 2 weeks (around 08/10/2019) for with dr yates to review mri right knee.   Orders:  No orders of the defined types were placed in this encounter.  No orders of the defined types were placed in this encounter.     Procedures: No procedures performed   Clinical Data: No additional findings.   Subjective: Chief Complaint  Patient presents with  . Right Knee - Pain    HPI 64 year old white female returns for recheck of her right knee pain.  Last office visit March 22, 2019 Dr. Lorin Mercy performed intra-articular Marcaine/Depo-Medrol injection.  States that she had temporary relief with this and is wanting to have the injection repeated today.  Patient has not had an MRI.  States that she feels some popping in her knee and it feels like it wants to give out when she steps wrong.  Most of her pain is medial compartment. Review of Systems No current cardiac pulmonary GI GU issues  Objective: Vital Signs: BP (!) 142/85   Pulse 83   Ht 5\' 4"  (1.626 m)   Wt  210 lb (95.3 kg)   BMI 36.05 kg/m   Physical Exam HENT:     Head: Normocephalic.  Pulmonary:     Effort: No respiratory distress.  Musculoskeletal:     Comments: Gait is somewhat antalgic negative logroll bilateral hips.  Right knee good range of motion.  She is exquisitely tender at the medial joint line.  Positive mcMurray's test.  Cruciate and collateral ligaments are stable.  Does have some knee swelling without significant effusion.  Calf nontender.  Positive patellofemoral l crepitus  Neurological:     General: No focal deficit present.     Mental Status: She is alert and oriented to person, place, and time.     Ortho Exam  Specialty Comments:  No specialty comments available.  Imaging: No results found.   PMFS History: Patient Active Problem List   Diagnosis Date Noted  . Other spondylosis with radiculopathy, cervical region 01/13/2019  . Chronic neck pain 12/22/2018  . History of total knee arthroplasty, left 07/21/2018  . Unilateral primary osteoarthritis, right knee 07/21/2018   Past Medical History:  Diagnosis Date  . Arthritis   . Chronic pain   . Contact lens/glasses fitting    wears contacts or glasses  . Hypertension   . Hypothyroidism   . Insomnia   . Seasonal  allergies   . Wears partial dentures    top and bottom partials    No family history on file.  Past Surgical History:  Procedure Laterality Date  . CHOLECYSTECTOMY    . KNEE ARTHROSCOPY Left 04/09/2007  . KNEE ARTHROSCOPY WITH MEDIAL MENISECTOMY Right 07/30/2012   Procedure: KNEE ARTHROSCOPY WITH MEDIAL AND LATERAL MENISECTOMIES, CHONDROPLASTY OF PATELLA;  Surgeon: Loreta Ave, MD;  Location: [facility];  Service: Orthopedics;  Laterality: Right;  . SHOULDER ARTHROSCOPY  8/13   right  . TOTAL KNEE ARTHROPLASTY Left 05/11/2008  . TOTAL SHOULDER ARTHROPLASTY  07/25/2011   Procedure: TOTAL SHOULDER ARTHROPLASTY;  Surgeon: Loreta Ave, MD;  Location: [facility];  Service: Orthopedics;  Laterality: Left;   Social History   Occupational History  . Not on file  Tobacco Use  . Smoking status: Never Smoker  . Smokeless tobacco: Never Used  Substance and Sexual Activity  . Alcohol use: Yes  . Drug use: No  . Sexual activity: Not on file

## 2019-10-01 ENCOUNTER — Other Ambulatory Visit: Payer: Self-pay | Admitting: Endocrinology

## 2019-10-01 DIAGNOSIS — E049 Nontoxic goiter, unspecified: Secondary | ICD-10-CM

## 2019-10-11 ENCOUNTER — Other Ambulatory Visit: Payer: BC Managed Care – PPO

## 2019-10-15 ENCOUNTER — Ambulatory Visit
Admission: RE | Admit: 2019-10-15 | Discharge: 2019-10-15 | Disposition: A | Discharge status: Home / Self Care | Payer: BC Managed Care – PPO | Source: Ambulatory Visit | Attending: Endocrinology | Admitting: Endocrinology

## 2019-10-15 DIAGNOSIS — E049 Nontoxic goiter, unspecified: Secondary | ICD-10-CM

## 2020-02-25 ENCOUNTER — Encounter: Payer: Self-pay | Admitting: Orthopaedic Surgery

## 2020-02-25 ENCOUNTER — Ambulatory Visit: Payer: BC Managed Care – PPO | Admitting: Orthopaedic Surgery

## 2020-02-25 VITALS — Ht 63.0 in | Wt 204.0 lb

## 2020-02-25 DIAGNOSIS — Z96652 Presence of left artificial knee joint: Secondary | ICD-10-CM | POA: Diagnosis not present

## 2020-02-25 DIAGNOSIS — M1711 Unilateral primary osteoarthritis, right knee: Secondary | ICD-10-CM | POA: Diagnosis not present

## 2020-02-25 NOTE — Progress Notes (Signed)
Office Visit Note   Patient: Mary Harvey           Date of Birth: 1955-12-30           MRN: [id number] Visit Date: 02/25/2020              Requested by: Hezzie Bump, FNP [address] [place],  [place] [address] PCP: Hezzie Bump, FNP   Assessment & Plan: Visit Diagnoses:  1. Unilateral primary osteoarthritis, right knee   2. History of total knee arthroplasty, left     Plan: Right knee injection performed which she tolerated well.  Left total knee from 2014 doing well.  We will recheck her in 2 months.  Follow-Up Instructions: Return in about 3 months (around 05/25/2020).   Orders:  Orders Placed This Encounter  Procedures   Large Joint Inj: R knee   No orders of the defined types were placed in this encounter.     Procedures: Large Joint Inj: R knee on 02/25/2020 1:35 PM Indications: pain and joint swelling Details: 22 G 1.5 in needle, anterolateral approach  Arthrogram: No  Medications: 40 mg methylPREDNISolone acetate 40 MG/ML; 0.5 mL lidocaine 1 %; 4 mL bupivacaine 0.25 % Outcome: tolerated well, no immediate complications Procedure, treatment alternatives, risks and benefits explained, specific risks discussed. Consent was given by the patient. Immediately prior to procedure a time out was called to verify the correct patient, procedure, equipment, support staff and site/side marked as required. Patient was prepped and draped in the usual sterile fashion.       Clinical Data: No additional findings.   Subjective: Chief Complaint  Patient presents with   Right Knee - Pain    HPI 65 year old female with previous knee injection 11 months ago 03/22/2019 with gradual recurrence of symptoms.  Patient states feels like her knee is going to give way despite over-the-counter anti-inflammatories.  She is also used Voltaren gel without relief.  Last few months symptoms have significantly progressed and she is requesting a repeat knee  injection.  Review of Systems system update unchanged from 04/17/2018 other than as mentioned above.  Previous left total knee arthroplasty 2014.  Right knee osteoarthritis symptomatic as above.   Objective: Vital Signs: Ht 5\' 3"  (1.6 m)    Wt 204 lb (92.5 kg)    BMI 36.14 kg/m   Physical Exam Constitutional:      Appearance: She is well-developed.  HENT:     Head: Normocephalic.     Right Ear: External ear normal.     Left Ear: External ear normal.  Eyes:     Pupils: Pupils are equal, round, and reactive to light.  Neck:     Thyroid: No thyromegaly.     Trachea: No tracheal deviation.  Cardiovascular:     Rate and Rhythm: Normal rate.  Pulmonary:     Effort: Pulmonary effort is normal.  Abdominal:     Palpations: Abdomen is soft.  Skin:    General: Skin is warm and dry.  Neurological:     Mental Status: She is alert and oriented to person, place, and time.  Psychiatric:        Mood and Affect: Mood and affect normal.        Behavior: Behavior normal.     Ortho Exam 2+ knee effusion crepitus with knee range of motion.  Patient reaches full extension.  Specialty Comments:  No specialty comments available.  Imaging: No results found.   PMFS History: Patient  Active Problem List   Diagnosis Date Noted   Other spondylosis with radiculopathy, cervical region 01/13/2019   Chronic neck pain 12/22/2018   History of total knee arthroplasty, left 07/21/2018   Unilateral primary osteoarthritis, right knee 07/21/2018   Past Medical History:  Diagnosis Date   Arthritis    Chronic pain    Contact lens/glasses fitting    wears contacts or glasses   Hypertension    Hypothyroidism    Insomnia    Seasonal allergies    Wears partial dentures    top and bottom partials    No family history on file.  Past Surgical History:  Procedure Laterality Date   CHOLECYSTECTOMY     KNEE ARTHROSCOPY Left 04/09/2007   KNEE ARTHROSCOPY WITH MEDIAL MENISECTOMY Right  07/30/2012   Procedure: KNEE ARTHROSCOPY WITH MEDIAL AND LATERAL MENISECTOMIES, CHONDROPLASTY OF PATELLA;  Surgeon: Loreta Ave, MD;  Location: [facility];  Service: Orthopedics;  Laterality: Right;   SHOULDER ARTHROSCOPY  8/13   right   TOTAL KNEE ARTHROPLASTY Left 05/11/2008   TOTAL SHOULDER ARTHROPLASTY  07/25/2011   Procedure: TOTAL SHOULDER ARTHROPLASTY;  Surgeon: Loreta Ave, MD;  Location: [facility];  Service: Orthopedics;  Laterality: Left;   Social History   Occupational History   Not on file  Tobacco Use   Smoking status: Never Smoker   Smokeless tobacco: Never Used  Substance and Sexual Activity   Alcohol use: Yes   Drug use: No   Sexual activity: Not on file

## 2020-03-03 MED ORDER — LIDOCAINE HCL 1 % IJ SOLN
0.5000 mL | INTRAMUSCULAR | Status: AC | PRN
  Administered 2020-02-25: .5 mL

## 2020-03-03 MED ORDER — BUPIVACAINE HCL 0.25 % IJ SOLN
4.0000 mL | INTRAMUSCULAR | Status: AC | PRN
  Administered 2020-02-25: 4 mL via INTRA_ARTICULAR

## 2020-03-03 MED ORDER — METHYLPREDNISOLONE ACETATE 40 MG/ML IJ SUSP
40.0000 mg | INTRAMUSCULAR | Status: AC | PRN
  Administered 2020-02-25: 40 mg via INTRA_ARTICULAR

## 2020-04-28 ENCOUNTER — Ambulatory Visit: Payer: BC Managed Care – PPO | Admitting: Orthopaedic Surgery

## 2020-06-02 ENCOUNTER — Ambulatory Visit: Payer: BC Managed Care – PPO | Admitting: Orthopaedic Surgery

## 2020-06-02 ENCOUNTER — Other Ambulatory Visit: Payer: Self-pay

## 2020-06-02 ENCOUNTER — Ambulatory Visit: Payer: Self-pay

## 2020-06-02 VITALS — BP 139/80 | HR 73

## 2020-06-02 DIAGNOSIS — M542 Cervicalgia: Secondary | ICD-10-CM

## 2020-06-02 DIAGNOSIS — M1711 Unilateral primary osteoarthritis, right knee: Secondary | ICD-10-CM | POA: Diagnosis not present

## 2020-06-02 DIAGNOSIS — M4722 Other spondylosis with radiculopathy, cervical region: Secondary | ICD-10-CM | POA: Diagnosis not present

## 2020-06-02 DIAGNOSIS — G8929 Other chronic pain: Secondary | ICD-10-CM

## 2020-06-02 NOTE — Progress Notes (Signed)
Office Visit Note   Patient: Mary Harvey           Date of Birth: 08-Oct-1955           MRN: [id number] Visit Date: 06/02/2020              Requested by: Hezzie Bump, FNP [address] [place],  [place] [address] PCP: Hezzie Bump, FNP   Assessment & Plan: Visit Diagnoses:  1. Neck pain   2. Other spondylosis with radiculopathy, cervical region   3. Unilateral primary osteoarthritis, right knee   4. Chronic neck pain     Plan: Patient been treated for neck for greater than 10 years.  She states she is now ready to consider surgical intervention due to progression of her pain in her neck and arms which bothers her when she works as well as when she does household chores.  We will proceed with the cervical MRI scan also follow-up after scan for review.  Previous MRI scan showed effacing the ventral spinal cord from C4-5 down to C6-7 with varying degrees of moderate to more severe foraminal stenosis.  Follow-Up Instructions: No follow-ups on file.   Orders:  Orders Placed This Encounter  Procedures  . XR Cervical Spine 2 or 3 views   No orders of the defined types were placed in this encounter.     Procedures: No procedures performed   Clinical Data: No additional findings.   Subjective: Chief Complaint  Patient presents with  . Right Knee - Follow-up    HPI 65 year old female returns with problems with both her neck that radiates into her shoulders and into her hands and also progressive right knee osteoarthritis primarily medial.  She had previous MRI which showed significant spondylosis with disc protrusion C4-5, C5-6 and C6-7.  This is progressed from 2013 MRI.  She states her arms are bothering her more at work she works for [employer] person has numbness and pain.  At times she feels sharp electrical pain that shoots into her thoracic spine particular with neck flexion.  With rotation she has pain that radiates into her shoulders.   She has had rotator cuff repair on the right and shoulder arthroplasty on the left.  She is also had left total knee arthroplasty done by Dr. Richardson Landry which is doing well.  Injection into right knee in January still gives her some relief and she states her neck is bothering her with her arms hands with pain and numbness worse in any problems with her knees.  Patient states she needs to get something done about her neck.  In the past she has had therapy, traction, anti-inflammatories, prednisone Dosepak, but several cervical epidurals.  Review of Systems all other systems are negative other than as mentioned above.   Objective: Vital Signs: BP 139/80   Pulse 73   Physical Exam Constitutional:      Appearance: She is well-developed.  HENT:     Head: Normocephalic.     Right Ear: External ear normal.     Left Ear: External ear normal.  Eyes:     Pupils: Pupils are equal, round, and reactive to light.  Neck:     Thyroid: No thyromegaly.     Trachea: No tracheal deviation.  Cardiovascular:     Rate and Rhythm: Normal rate.  Pulmonary:     Effort: Pulmonary effort is normal.  Abdominal:     Palpations: Abdomen is soft.  Skin:    General:  Skin is warm and dry.  Neurological:     Mental Status: She is alert and oriented to person, place, and time.  Psychiatric:        Behavior: Behavior normal.     Ortho Exam patient has positive Spurling worse on the right than left with significant brachial plexus tenderness on the right.  NegativeLhermitte.  Upper extremity reflexes are 1+ and symmetrical.  She can get her arms up overhead negative drop arm test right and left. Specialty Comments:  No specialty comments available.  Imaging: No results found.   PMFS History: Patient Active Problem List   Diagnosis Date Noted  . Other spondylosis with radiculopathy, cervical region 01/13/2019  . Chronic neck pain 12/22/2018  . History of total knee arthroplasty, left 07/21/2018  .  Unilateral primary osteoarthritis, right knee 07/21/2018   Past Medical History:  Diagnosis Date  . Arthritis   . Chronic pain   . Contact lens/glasses fitting    wears contacts or glasses  . Hypertension   . Hypothyroidism   . Insomnia   . Seasonal allergies   . Wears partial dentures    top and bottom partials    No family history on file.  Past Surgical History:  Procedure Laterality Date  . CHOLECYSTECTOMY    . KNEE ARTHROSCOPY Left 04/09/2007  . KNEE ARTHROSCOPY WITH MEDIAL MENISECTOMY Right 07/30/2012   Procedure: KNEE ARTHROSCOPY WITH MEDIAL AND LATERAL MENISECTOMIES, CHONDROPLASTY OF PATELLA;  Surgeon: Loreta Ave, MD;  Location: [facility];  Service: Orthopedics;  Laterality: Right;  . SHOULDER ARTHROSCOPY  8/13   right  . TOTAL KNEE ARTHROPLASTY Left 05/11/2008  . TOTAL SHOULDER ARTHROPLASTY  07/25/2011   Procedure: TOTAL SHOULDER ARTHROPLASTY;  Surgeon: Loreta Ave, MD;  Location: [facility];  Service: Orthopedics;  Laterality: Left;   Social History   Occupational History  . Not on file  Tobacco Use  . Smoking status: Never Smoker  . Smokeless tobacco: Never Used  Substance and Sexual Activity  . Alcohol use: Yes  . Drug use: No  . Sexual activity: Not on file

## 2020-06-17 ENCOUNTER — Other Ambulatory Visit: Payer: Self-pay

## 2020-06-17 ENCOUNTER — Ambulatory Visit
Admission: RE | Admit: 2020-06-17 | Discharge: 2020-06-17 | Disposition: A | Discharge status: Home / Self Care | Payer: BC Managed Care – PPO | Source: Ambulatory Visit | Attending: Orthopaedic Surgery | Admitting: Orthopaedic Surgery

## 2020-06-17 DIAGNOSIS — M542 Cervicalgia: Secondary | ICD-10-CM

## 2020-07-11 ENCOUNTER — Other Ambulatory Visit: Payer: Self-pay

## 2020-07-11 ENCOUNTER — Encounter: Payer: Self-pay | Admitting: Orthopaedic Surgery

## 2020-07-11 ENCOUNTER — Ambulatory Visit: Payer: BC Managed Care – PPO | Admitting: Orthopaedic Surgery

## 2020-07-11 VITALS — BP 120/69 | HR 97 | Ht 64.0 in | Wt 201.0 lb

## 2020-07-11 DIAGNOSIS — M4722 Other spondylosis with radiculopathy, cervical region: Secondary | ICD-10-CM

## 2020-07-11 DIAGNOSIS — M542 Cervicalgia: Secondary | ICD-10-CM | POA: Diagnosis not present

## 2020-07-17 NOTE — Progress Notes (Signed)
Office Visit Note   Patient: Mary Harvey           Date of Birth: 10-30-1955           MRN: [id number] Visit Date: 07/11/2020              Requested by: Hezzie Bump, FNP [address] [place],  [place] [address] PCP: Hezzie Bump, FNP   Assessment & Plan: Visit Diagnoses:  1. Neck pain   2. Other spondylosis with radiculopathy, cervical region     Plan: We will set patient up for single epidural cervical spine with Dr. Alvester Morin for multilevel spondylosis.  She does not respond to this then we will consider surgical options for progressive spondylosis x10 years.  Follow-Up Instructions: No follow-ups on file.   Orders:  Orders Placed This Encounter  Procedures  . Ambulatory referral to Physical Medicine Rehab   No orders of the defined types were placed in this encounter.     Procedures: No procedures performed   Clinical Data: No additional findings.   Subjective: Chief Complaint  Patient presents with  . Neck - Pain, Follow-up    MRI cervical spine review    HPI 65 year old female seen with ongoing problems with neck problems and pain and MRI scan cervical spine is available for review.  Pain has been present for greater than 10 years.  We discussed surgical intervention in the past for spondylosis with effacement of the ventral spinal cord C4-C7 variable ball degrees with moderate to severe foraminal stenosis.  Progression from 2013 2022.  Electrical symptoms shooting into her thoracic region with neck flexion.  Pain into her shoulders with rotation.  Previous total knee arthroplasties by Dr. Richardson Landry.  Previous rotator cuff repairs right and left and shoulder arthroplasty on the left.  She has been through therapy traction anti-inflammatories prednisone Dosepak.  Cervical epidurals in the distant past gave her good relief.  Review of Systems all the systems noncontributory to HPI other than as mentioned above.   Objective: Vital Signs: BP  120/69   Pulse 97   Ht 5\' 4"  (1.626 m)   Wt 201 lb (91.2 kg)   BMI 34.50 kg/m   Physical Exam Constitutional:      Appearance: She is well-developed.  HENT:     Head: Normocephalic.     Right Ear: External ear normal.     Left Ear: External ear normal.  Eyes:     Pupils: Pupils are equal, round, and reactive to light.  Neck:     Thyroid: No thyromegaly.     Trachea: No tracheal deviation.  Cardiovascular:     Rate and Rhythm: Normal rate.  Pulmonary:     Effort: Pulmonary effort is normal.  Abdominal:     Palpations: Abdomen is soft.  Skin:    General: Skin is warm and dry.  Neurological:     Mental Status: She is alert and oriented to person, place, and time.  Psychiatric:        Behavior: Behavior normal.     Ortho Exam positive Spurling right greater than left.  Increased discomfort with compression minimal improvement with distraction of the cervical spine.  Reflexes are 1+ no lower extremity clonus.  Negative drop arm test right and left.  Specialty Comments:  No specialty comments available.  Imaging: No results found. CLINICAL DATA:  Neck pain extending down both arms with pain for 2 years, worsening  EXAM: MRI CERVICAL SPINE WITHOUT CONTRAST  TECHNIQUE: Multiplanar, multisequence MR imaging of the cervical spine was performed. No intravenous contrast was administered.  COMPARISON:  02/06/2019  FINDINGS: Alignment: Degenerative reversal of cervical lordosis.  Vertebrae: No fracture, evidence of discitis, or bone lesion.  Cord: Normal signal and morphology.  Posterior Fossa, vertebral arteries, paraspinal tissues: Subcutaneous scar-like appearance posterior to the C2 vertebra.  Partially empty sella with prominent sellar enlargement, unchanged 2020 and incidental to the history.  Disc levels:  C2-3: Unremarkable.  C3-4: Shallow central protrusion  C4-5: Disc narrowing with downward pointing extrusion contacting but not  compressing the cord. Mild right foraminal narrowing.  C5-6: Disc narrowing and bulging with buttressing osteophytes. Shallow right paracentral protrusion. The canal and foramina are patent  C6-7: Disc narrowing with downward pointing extrusion contacting but not compressing the cord. Mild right foraminal narrowing.  C7-T1:Borderline disc bulging.  IMPRESSION: 1. 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 There is a degenerative reversal of cervical lordosis with mild C7-T1 anterolisthesis that is progressive. 2. No high-grade foraminal narrowing.   Electronically Signed   By: Marnee Spring M.D.   On: 06/18/2020 20:09   PMFS History: Patient Active Problem List   Diagnosis Date Noted  . Other spondylosis with radiculopathy, cervical region 01/13/2019  . Chronic neck pain 12/22/2018  . History of total knee arthroplasty, left 07/21/2018  . Unilateral primary osteoarthritis, right knee 07/21/2018   Past Medical History:  Diagnosis Date  . Arthritis   . Chronic pain   . Contact lens/glasses fitting    wears contacts or glasses  . Hypertension   . Hypothyroidism   . Insomnia   . Seasonal allergies   . Wears partial dentures    top and bottom partials    No family history on file.  Past Surgical History:  Procedure Laterality Date  . CHOLECYSTECTOMY    . KNEE ARTHROSCOPY Left 04/09/2007  . KNEE ARTHROSCOPY WITH MEDIAL MENISECTOMY Right 07/30/2012   Procedure: KNEE ARTHROSCOPY WITH MEDIAL AND LATERAL MENISECTOMIES, CHONDROPLASTY OF PATELLA;  Surgeon: Loreta Ave, MD;  Location: [facility];  Service: Orthopedics;  Laterality: Right;  . SHOULDER ARTHROSCOPY  8/13   right  . TOTAL KNEE ARTHROPLASTY Left 05/11/2008  . TOTAL SHOULDER ARTHROPLASTY  07/25/2011   Procedure: TOTAL SHOULDER ARTHROPLASTY;  Surgeon: Loreta Ave, MD;  Location: [facility];  Service: Orthopedics;   Laterality: Left;   Social History   Occupational History  . Not on file  Tobacco Use  . Smoking status: Never Smoker  . Smokeless tobacco: Never Used  Substance and Sexual Activity  . Alcohol use: Yes  . Drug use: No  . Sexual activity: Not on file

## 2020-07-24 ENCOUNTER — Other Ambulatory Visit: Payer: Self-pay

## 2020-07-24 ENCOUNTER — Ambulatory Visit (INDEPENDENT_AMBULATORY_CARE_PROVIDER_SITE_OTHER): Payer: BC Managed Care – PPO | Admitting: Physical Medicine and Rehabilitation

## 2020-07-24 ENCOUNTER — Ambulatory Visit: Payer: Self-pay

## 2020-07-24 ENCOUNTER — Encounter: Payer: Self-pay | Admitting: Physical Medicine and Rehabilitation

## 2020-07-24 VITALS — BP 129/85 | HR 82

## 2020-07-24 DIAGNOSIS — M5412 Radiculopathy, cervical region: Secondary | ICD-10-CM

## 2020-07-24 MED ORDER — BETAMETHASONE SOD PHOS & ACET 6 (3-3) MG/ML IJ SUSP
12.0000 mg | Freq: Once | INTRAMUSCULAR | Status: AC
  Administered 2020-07-24: 12 mg

## 2020-07-24 NOTE — Progress Notes (Signed)
Mary Harvey - 65 y.o. female MRN [id number]  Date of birth: May 11, 1955  Office Visit Note: Visit Date: 07/24/2020 PCP: Hezzie Bump, FNP Referred by: Hezzie Bump, F*  Subjective: Chief Complaint  Patient presents with   Neck - Pain   Right Shoulder - Pain   Left Shoulder - Pain   Left Hand - Numbness   Right Hand - Numbness   HPI:  Mary Harvey is a 66 y.o. female who comes in today at the request of Dr. Annell Greening for planned Right C7-T1 Cervical Interlaminar epidural steroid injection with fluoroscopic guidance.  The patient has failed conservative care including home exercise, medications, time and activity modification.  This injection will be diagnostic and hopefully therapeutic.  Please see requesting physician notes for further details and justification. MRI reviewed with images and spine model.  MRI reviewed in the note below.     ROS Otherwise per HPI.  Assessment & Plan: Visit Diagnoses:    ICD-10-CM   1. Cervical radiculopathy  M54.12 XR C-ARM NO REPORT    Epidural Steroid injection    betamethasone acetate-betamethasone sodium phosphate (CELESTONE) injection 12 mg      Plan: No additional findings.   Meds & Orders:  Meds ordered this encounter  Medications   betamethasone acetate-betamethasone sodium phosphate (CELESTONE) injection 12 mg    Orders Placed This Encounter  Procedures   XR C-ARM NO REPORT   Epidural Steroid injection    Follow-up: Return for visit to requesting physician as needed.   Procedures: No procedures performed  Cervical Epidural Steroid Injection - Interlaminar Approach with Fluoroscopic Guidance  Patient: Mary Harvey      Date of Birth: 1955/12/05 MRN: [id number] PCP: Hezzie Bump, FNP      Visit Date: 07/24/2020   Universal Protocol:    Date/Time: 06/13/228:45 AM  Consent Given By: the patient  Position: PRONE  Additional Comments: Vital signs were monitored before and after the  procedure. Patient was prepped and draped in the usual sterile fashion. The correct patient, procedure, and site was verified.   Injection Procedure Details:   Procedure diagnoses: Cervical radiculopathy [M54.12]    Meds Administered:  Meds ordered this encounter  Medications   betamethasone acetate-betamethasone sodium phosphate (CELESTONE) injection 12 mg     Laterality: Right  Location/Site: C7-T1  Needle: 3.5 in., 20 ga. Tuohy  Needle Placement: Paramedian epidural space  Findings:  -Comments: Excellent flow of contrast into the epidural space.  Procedure Details: Using a paramedian approach from the side mentioned above, the region overlying the inferior lamina was localized under fluoroscopic visualization and the soft tissues overlying this structure were infiltrated with 4 ml. of 1% Lidocaine without Epinephrine. A # 20 gauge, Tuohy needle was inserted into the epidural space using a paramedian approach.  The epidural space was localized using loss of resistance along with contralateral oblique bi-planar fluoroscopic views.  After negative aspirate for air, blood, and CSF, a 2 ml. volume of Isovue-250 was injected into the epidural space and the flow of contrast was observed. Radiographs were obtained for documentation purposes.   The injectate was administered into the level noted above.  Additional Comments:  The patient tolerated the procedure well Dressing: 2 x 2 sterile gauze and Band-Aid    Post-procedure details: Patient was observed during the procedure. Post-procedure instructions were reviewed.  Patient left the clinic in stable condition.   Clinical History: MRI CERVICAL SPINE WITHOUT CONTRAST   TECHNIQUE: Multiplanar, multisequence  MR imaging of the cervical spine was performed. No intravenous contrast was administered.   COMPARISON:  02/06/2019   FINDINGS: Alignment: Degenerative reversal of cervical lordosis.   Vertebrae: No fracture,  evidence of discitis, or bone lesion.   Cord: Normal signal and morphology.   Posterior Fossa, vertebral arteries, paraspinal tissues: Subcutaneous scar-like appearance posterior to the C2 vertebra.   Partially empty sella with prominent sellar enlargement, unchanged 2020 and incidental to the history.   Disc levels:   C2-3: Unremarkable.   C3-4: Shallow central protrusion   C4-5: Disc narrowing with downward pointing extrusion contacting but not compressing the cord. Mild right foraminal narrowing.   C5-6: Disc narrowing and bulging with buttressing osteophytes. Shallow right paracentral protrusion. The canal and foramina are patent   C6-7: Disc narrowing with downward pointing extrusion contacting but not compressing the cord. Mild right foraminal narrowing.   C7-T1:Borderline disc bulging.   IMPRESSION: 1. 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 There is a degenerative reversal of cervical lordosis with mild C7-T1 anterolisthesis that is progressive. 2. No high-grade foraminal narrowing.     Electronically Signed   By: Marnee Spring M.D.   On: 06/18/2020 20:09     Objective:  VS:  HT:    WT:   BMI:     BP:129/85  HR:82bpm  TEMP: ( )  RESP:  Physical Exam Vitals and nursing note reviewed.  Constitutional:      General: She is not in acute distress.    Appearance: Normal appearance. She is not ill-appearing.  HENT:     Head: Normocephalic and atraumatic.     Right Ear: External ear normal.     Left Ear: External ear normal.  Eyes:     Extraocular Movements: Extraocular movements intact.  Cardiovascular:     Rate and Rhythm: Normal rate.     Pulses: Normal pulses.  Musculoskeletal:     Cervical back: Tenderness present. No rigidity.     Right lower leg: No edema.     Left lower leg: No edema.     Comments: Patient has good strength in the upper extremities including 5 out of 5 strength in wrist  extension long finger flexion and APB.  There is no atrophy of the hands intrinsically.  There is a negative Hoffmann's test.   Lymphadenopathy:     Cervical: No cervical adenopathy.  Skin:    Findings: No erythema, lesion or rash.  Neurological:     General: No focal deficit present.     Mental Status: She is alert and oriented to person, place, and time.     Sensory: No sensory deficit.     Motor: No weakness or abnormal muscle tone.     Coordination: Coordination normal.  Psychiatric:        Mood and Affect: Mood normal.        Behavior: Behavior normal.     Imaging: No results found.

## 2020-07-24 NOTE — Progress Notes (Signed)
Pt state neck pain that travels down both shoulder and arm. Pt state her hands feels numb. Pt state when she reach for item or bending over she has pain. Pt she feels dizzying when she get up. Pt state bio o freeze and pain meds to help ease her pain.  Numeric Pain Rating Scale and Functional Assessment Average Pain 6   In the last MONTH (on 0-10 scale) has pain interfered with the following?  1. General activity like being  able to carry out your everyday physical activities such as walking, climbing stairs, carrying groceries, or moving a chair?  Rating(9)   +Driver, -BT, -Dye Allergies.

## 2020-07-24 NOTE — Procedures (Signed)
Cervical Epidural Steroid Injection - Interlaminar Approach with Fluoroscopic Guidance  Patient: Mary Harvey      Date of Birth: August 05, 1955 MRN: [id number] PCP: Hezzie Bump, FNP      Visit Date: 07/24/2020   Universal Protocol:    Date/Time: 06/13/228:45 AM  Consent Given By: the patient  Position: PRONE  Additional Comments: Vital signs were monitored before and after the procedure. Patient was prepped and draped in the usual sterile fashion. The correct patient, procedure, and site was verified.   Injection Procedure Details:   Procedure diagnoses: Cervical radiculopathy [M54.12]    Meds Administered:  Meds ordered this encounter  Medications   betamethasone acetate-betamethasone sodium phosphate (CELESTONE) injection 12 mg     Laterality: Right  Location/Site: C7-T1  Needle: 3.5 in., 20 ga. Tuohy  Needle Placement: Paramedian epidural space  Findings:  -Comments: Excellent flow of contrast into the epidural space.  Procedure Details: Using a paramedian approach from the side mentioned above, the region overlying the inferior lamina was localized under fluoroscopic visualization and the soft tissues overlying this structure were infiltrated with 4 ml. of 1% Lidocaine without Epinephrine. A # 20 gauge, Tuohy needle was inserted into the epidural space using a paramedian approach.  The epidural space was localized using loss of resistance along with contralateral oblique bi-planar fluoroscopic views.  After negative aspirate for air, blood, and CSF, a 2 ml. volume of Isovue-250 was injected into the epidural space and the flow of contrast was observed. Radiographs were obtained for documentation purposes.   The injectate was administered into the level noted above.  Additional Comments:  The patient tolerated the procedure well Dressing: 2 x 2 sterile gauze and Band-Aid    Post-procedure details: Patient was observed during the  procedure. Post-procedure instructions were reviewed.  Patient left the clinic in stable condition.

## 2020-07-24 NOTE — Patient Instructions (Signed)

## 2020-10-03 ENCOUNTER — Encounter: Payer: Self-pay | Admitting: Orthopaedic Surgery

## 2020-10-03 ENCOUNTER — Other Ambulatory Visit: Payer: Self-pay

## 2020-10-03 ENCOUNTER — Ambulatory Visit: Payer: BC Managed Care – PPO | Admitting: Orthopaedic Surgery

## 2020-10-03 VITALS — BP 130/81 | HR 94 | Ht 64.0 in | Wt 204.0 lb

## 2020-10-03 DIAGNOSIS — M1711 Unilateral primary osteoarthritis, right knee: Secondary | ICD-10-CM

## 2020-10-04 NOTE — Progress Notes (Signed)
Office Visit Note   Patient: Mary Harvey           Date of Birth: 03-Nov-1955           MRN: [id number] Visit Date: 10/03/2020              Requested by: Hezzie Bump, FNP [address] [place],  [place] [address] PCP: Hezzie Bump, FNP   Assessment & Plan: Visit Diagnoses:  1. Unilateral primary osteoarthritis, right knee     Plan: Patient is x-rays demonstrate 75% medial joint space narrowing mild varus and medial spurring with patellofemoral degenerative changes.  She has been treated with anti-inflammatories injection, Voltaren gel without relief.  Patient concerned about ability to ambulate with amount of pain she is having when she works and ambulates in [employer].  Patient states she is ready proceed with right total knee arthroplasty.  We discussed anesthesia, outpatient therapy PT at home followed by outpatient exercises.  Questions were elicited and answered she understands and requests we proceed.  Follow-Up Instructions: No follow-ups on file.   Orders:  No orders of the defined types were placed in this encounter.  No orders of the defined types were placed in this encounter.     Procedures: No procedures performed   Clinical Data: No additional findings.   Subjective: Chief Complaint  Patient presents with   Neck - Follow-up, Pain   Right Knee - Pain    HPI 65 year old female returns with ongoing problems with right knee pain and cervical spondylosis post cervical epidural 07/24/2021.  She states she got relief for about a month with the injection.  She has pain worse on the right than left with occasional numbness and tingling.  Injection in her right knee in the past had given her relief but the injection in January has not been helpful.  Knee has been popping catching morning to give out.  She is use Voltaren gel slight improvement.  Patient placed on Trulicity for weight loss and is prediabetic.  Previous left total knee  arthroplasty doing well.  She is having trouble sleeping ambulating community with her progressive right knee symptoms.  Review of Systems positive for cervical spondylosis with narrowing C4-C7 multilevel.  Previous rotator cuff surgeries.  Previous left total knee arthroplasty doing well.  On chronic right knee osteoarthritis symptoms.  All other systems noncontributory to HPI.   Objective: Vital Signs: BP 130/81   Pulse 94   Ht 5\' 4"  (1.626 m)   Wt 204 lb (92.5 kg)   BMI 35.02 kg/m   Physical Exam Constitutional:      Appearance: She is well-developed.  HENT:     Head: Normocephalic.     Right Ear: External ear normal.     Left Ear: External ear normal. There is no impacted cerumen.  Eyes:     Pupils: Pupils are equal, round, and reactive to light.  Neck:     Thyroid: No thyromegaly.     Trachea: No tracheal deviation.  Cardiovascular:     Rate and Rhythm: Normal rate.  Pulmonary:     Effort: Pulmonary effort is normal.  Abdominal:     Palpations: Abdomen is soft.  Musculoskeletal:     Cervical back: No rigidity.  Skin:    General: Skin is warm and dry.  Neurological:     Mental Status: She is alert and oriented to person, place, and time.  Psychiatric:        Behavior: Behavior normal.  Ortho Exam patient has right knee crepitus with range of motion pain with patellofemoral loading.  Medial lateral joint line tenderness.  Negative logroll the hips distal pulses are intact no sciatic notch tenderness.  Good quad strength.  Knee reaches full extension flexes to 120 degrees.  Specialty Comments:  No specialty comments available.  Imaging: No results found.   PMFS History: Patient Active Problem List   Diagnosis Date Noted   Other spondylosis with radiculopathy, cervical region 01/13/2019   Chronic neck pain 12/22/2018   History of total knee arthroplasty, left 07/21/2018   Unilateral primary osteoarthritis, right knee 07/21/2018   Past Medical History:   Diagnosis Date   Arthritis    Chronic pain    Contact lens/glasses fitting    wears contacts or glasses   Hypertension    Hypothyroidism    Insomnia    Seasonal allergies    Wears partial dentures    top and bottom partials    No family history on file.  Past Surgical History:  Procedure Laterality Date   CHOLECYSTECTOMY     KNEE ARTHROSCOPY Left 04/09/2007   KNEE ARTHROSCOPY WITH MEDIAL MENISECTOMY Right 07/30/2012   Procedure: KNEE ARTHROSCOPY WITH MEDIAL AND LATERAL MENISECTOMIES, CHONDROPLASTY OF PATELLA;  Surgeon: Loreta Ave, MD;  Location: [facility];  Service: Orthopedics;  Laterality: Right;   SHOULDER ARTHROSCOPY  8/13   right   TOTAL KNEE ARTHROPLASTY Left 05/11/2008   TOTAL SHOULDER ARTHROPLASTY  07/25/2011   Procedure: TOTAL SHOULDER ARTHROPLASTY;  Surgeon: Loreta Ave, MD;  Location: [facility];  Service: Orthopedics;  Laterality: Left;   Social History   Occupational History   Not on file  Tobacco Use   Smoking status: Never   Smokeless tobacco: Never  Substance and Sexual Activity   Alcohol use: Yes   Drug use: No   Sexual activity: Not on file

## 2020-10-17 ENCOUNTER — Telehealth: Payer: Self-pay | Admitting: Orthopaedic Surgery

## 2020-10-17 NOTE — Telephone Encounter (Signed)
Disability form & FMLA form received. To [employer].

## 2020-10-19 ENCOUNTER — Other Ambulatory Visit: Payer: Self-pay

## 2020-10-26 ENCOUNTER — Other Ambulatory Visit: Payer: Self-pay

## 2020-10-26 ENCOUNTER — Encounter: Payer: Self-pay | Admitting: Surgery

## 2020-10-26 ENCOUNTER — Ambulatory Visit (INDEPENDENT_AMBULATORY_CARE_PROVIDER_SITE_OTHER): Payer: BC Managed Care – PPO | Admitting: Surgery

## 2020-10-26 VITALS — BP 155/89 | HR 78 | Ht 64.0 in | Wt 204.0 lb

## 2020-10-26 DIAGNOSIS — G4730 Sleep apnea, unspecified: Secondary | ICD-10-CM

## 2020-10-26 DIAGNOSIS — M1711 Unilateral primary osteoarthritis, right knee: Secondary | ICD-10-CM

## 2020-10-26 NOTE — Progress Notes (Signed)
65 year old black female history of end-stage DJD right knee and pain comes in for preop evaluation.  States that symptoms unchanged from previous visit.  She is wanting to proceed with right total knee replacement as scheduled.  Today history and physical performed.  Review of systems negative.  Patient states that she does have a history of sleep apnea and uses CPAP.  Surgical procedure discussed along with potential hospital stay.  All questions answered.  States that she is familiar with what to expect since she had left total knee replacement performed by Dr. Eulah Pont in 2010.  I advised patient to bring her CPAP mask and hose with her to the hospital.

## 2020-11-01 NOTE — Pre-Procedure Instructions (Addendum)
Surgical Instructions:    Your procedure is scheduled on Monday, September 26th (12:30 PM- 2:37 PM).  Report to [facility] Main Entrance "A" at 10:30 A.M., then check in with the Admitting office.  Call this number if you have any questions prior to your surgery date, or have problems the morning of surgery:  [phone]    Remember:  Do not eat after midnight the night before your surgery.  You may drink clear liquids until 09:30 AM the morning of your surgery.   Clear liquids allowed are: Water, Non-Citrus Juices (without pulp), Carbonated Beverages, Clear Tea, Black Coffee Only, and Gatorade.   Enhanced Recovery after Surgery for Orthopedics Enhanced Recovery after Surgery is a protocol used to improve the stress on your body and your recovery after surgery.   The day of surgery (if you have diabetes):  Drink ONE Gatorade G2 by 09:30 AM the morning of surgery This bottle was given to you during your hospital pre-op appointment visit.  Nothing else to drink after completing the Small 10 oz bottle of water.         If you have questions, please contact your surgeon's office.     Take these medicines the morning of surgery with A SIP OF WATER:   IF NEEDED: cetirizine (ZYRTEC)  oxyCODONE-acetaminophen (PERCOCET)  albuterol (VENTOLIN HFA) inhaler- Please bring with you the day of surgery XIIDRA eye drops   As of today, STOP taking any Aspirin (unless otherwise instructed by your surgeon), NSAIDs such as aspirin-sod bicarb-citric acid (ALKA-SELTZER), diclofenac Sodium (VOLTAREN) GEL, Aleve, Naproxen, Ibuprofen, Motrin, Advil, Goody's, BC's, all herbal medications, fish oil, and all vitamins.   WHAT DO I DO ABOUT MY DIABETES MEDICATION?  The day of surgery, do not take other diabetes injectables, including Trulicity (dulaglutide).    HOW TO MANAGE YOUR DIABETES BEFORE AND AFTER SURGERY  Why is it important to control my blood sugar before and after surgery? Improving  blood sugar levels before and after surgery helps healing and can limit problems. A way of improving blood sugar control is eating a healthy diet by:  Eating less sugar and carbohydrates  Increasing activity/exercise  Talking with your doctor about reaching your blood sugar goals High blood sugars (greater than 180 mg/dL) can raise your risk of infections and slow your recovery, so you will need to focus on controlling your diabetes during the weeks before surgery. Make sure that the doctor who takes care of your diabetes knows about your planned surgery including the date and location.  How do I manage my blood sugar before surgery? Check your blood sugar at least 4 times a day, starting 2 days before surgery, to make sure that the level is not too high or low.  Check your blood sugar the morning of your surgery when you wake up and every 2 hours until you get to the Short Stay unit.  If your blood sugar is less than 70 mg/dL, you will need to treat for low blood sugar: Do not take insulin. Treat a low blood sugar (less than 70 mg/dL) with  cup of clear juice (cranberry or apple), 4 glucose tablets, OR glucose gel. Recheck blood sugar in 15 minutes after treatment (to make sure it is greater than 70 mg/dL). If your blood sugar is not greater than 70 mg/dL on recheck, call [phone] for further instructions. Report your blood sugar to the short stay nurse when you get to Short Stay.  If you are admitted to the hospital  after surgery: Your blood sugar will be checked by the staff and you will probably be given insulin after surgery (instead of oral diabetes medicines) to make sure you have good blood sugar levels. The goal for blood sugar control after surgery is 80-180 mg/dL.             Special instructions:    [facility]- Preparing For Surgery  Before surgery, you can play an important role. Because skin is not sterile, your skin needs to be as free of germs as possible. You can  reduce the number of germs on your skin by washing with CHG (chlorahexidine gluconate) Soap before surgery.  CHG is an antiseptic cleaner which kills germs and bonds with the skin to continue killing germs even after washing.     Please do not use if you have an allergy to CHG or antibacterial soaps. If your skin becomes reddened/irritated stop using the CHG.  Do not shave (including legs and underarms) for at least 48 hours prior to first CHG shower. It is OK to shave your face.  Please follow these instructions carefully.     Shower the NIGHT BEFORE SURGERY and the MORNING OF SURGERY with CHG Soap.   If you chose to wash your hair, wash your hair first as usual with your normal shampoo. After you shampoo, rinse your hair and body thoroughly to remove the shampoo.  Then [employer] and genitals (private parts) with your normal soap and rinse thoroughly to remove soap.  After that Use CHG Soap as you would any other liquid soap. You can apply CHG directly to the skin and wash gently with a clean washcloth.   Apply the CHG Soap to your body ONLY FROM THE NECK DOWN.  Do not use on open wounds or open sores. Avoid contact with your eyes, ears, mouth and genitals (private parts). Wash Face and genitals (private parts)  with your normal soap.   Wash thoroughly, paying special attention to the area where your surgery will be performed.  Thoroughly rinse your body with warm water from the neck down.  DO NOT shower/wash with your normal soap after using and rinsing off the CHG Soap.  Pat yourself dry with a CLEAN TOWEL.  Wear CLEAN PAJAMAS to bed the night before surgery.  Place CLEAN SHEETS on your bed the night before your surgery.  DO NOT SLEEP WITH PETS.   Day of Surgery:  Take a shower with CHG soap. Wear Clean/Comfortable clothing the morning of surgery Do not wear lotions, powders, perfumes, or deodorant.   Remember to brush your teeth WITH YOUR REGULAR TOOTHPASTE. Do not wear  jewelry or makeup. DO Not wear nail polish, gel polish, artificial nails, or any other type of covering on natural nails including finger and toenails. If patients have artificial nails, gel coating, etc. that need to be removed by a nail salon please have this removed prior to surgery or surgery may need to be canceled/delayed if the surgeon/ anesthesia feels like the patient is unable to be adequately monitored. Do not shave 48 hours prior to surgery.  Men may shave face and neck. Do not bring valuables to the hospital. [facility] is not responsible for any belongings or valuables.  Do NOT Smoke (Tobacco/Vaping) or drink Alcohol 24 hours prior to your procedure.  If you use a CPAP at night, you may bring all equipment for your overnight stay.   Contacts, glasses, dentures or bridgework may not be worn into surgery,  please bring cases for these belongings.   For patients admitted to the hospital, discharge time will be determined by your treatment team.  Patients discharged the day of surgery will not be allowed to drive home, and someone needs to stay with them for 24 hours.  NO VISITORS WILL BE ALLOWED IN PRE-OP WHERE PATIENTS GET READY FOR SURGERY.  ONLY 1 SUPPORT PERSON MAY BE PRESENT WHILE YOU ARE IN SURGERY.  IF YOU ARE TO BE ADMITTED, ONCE YOU ARE IN YOUR ROOM YOU WILL BE ALLOWED TWO (2) VISITORS.  Minor children may have two parents present. Special consideration for safety and communication needs will be reviewed on a case by case basis.    Please read over the following fact sheets that you were given.

## 2020-11-01 NOTE — Progress Notes (Signed)
[employer] Zonia Kief, PA-C requesting procedure orders.

## 2020-11-02 ENCOUNTER — Other Ambulatory Visit: Payer: Self-pay

## 2020-11-02 ENCOUNTER — Telehealth: Payer: Self-pay | Admitting: Orthopaedic Surgery

## 2020-11-02 ENCOUNTER — Encounter (HOSPITAL_COMMUNITY): Payer: Self-pay

## 2020-11-02 ENCOUNTER — Encounter (HOSPITAL_COMMUNITY)
Admission: RE | Admit: 2020-11-02 | Discharge: 2020-11-02 | Disposition: A | Discharge status: Home / Self Care | Payer: BC Managed Care – PPO | Source: Ambulatory Visit | Attending: Orthopaedic Surgery | Admitting: Orthopaedic Surgery

## 2020-11-02 DIAGNOSIS — Z79899 Other long term (current) drug therapy: Secondary | ICD-10-CM | POA: Insufficient documentation

## 2020-11-02 DIAGNOSIS — Z7982 Long term (current) use of aspirin: Secondary | ICD-10-CM | POA: Insufficient documentation

## 2020-11-02 DIAGNOSIS — Z8673 Personal history of transient ischemic attack (TIA), and cerebral infarction without residual deficits: Secondary | ICD-10-CM | POA: Diagnosis not present

## 2020-11-02 DIAGNOSIS — M1711 Unilateral primary osteoarthritis, right knee: Secondary | ICD-10-CM | POA: Diagnosis not present

## 2020-11-02 DIAGNOSIS — Z20822 Contact with and (suspected) exposure to covid-19: Secondary | ICD-10-CM | POA: Diagnosis not present

## 2020-11-02 DIAGNOSIS — Z01818 Encounter for other preprocedural examination: Secondary | ICD-10-CM | POA: Diagnosis not present

## 2020-11-02 DIAGNOSIS — G4733 Obstructive sleep apnea (adult) (pediatric): Secondary | ICD-10-CM | POA: Insufficient documentation

## 2020-11-02 DIAGNOSIS — E039 Hypothyroidism, unspecified: Secondary | ICD-10-CM | POA: Insufficient documentation

## 2020-11-02 DIAGNOSIS — I10 Essential (primary) hypertension: Secondary | ICD-10-CM | POA: Diagnosis not present

## 2020-11-02 HISTORY — DX: Transient cerebral ischemic attack, unspecified: G45.9

## 2020-11-02 HISTORY — DX: Cerebral infarction, unspecified: I63.9

## 2020-11-02 HISTORY — DX: Unspecified asthma, uncomplicated: J45.909

## 2020-11-02 HISTORY — DX: Sleep apnea, unspecified: G47.30

## 2020-11-02 HISTORY — DX: Gastro-esophageal reflux disease without esophagitis: K21.9

## 2020-11-02 LAB — CBC
HCT: 40.4 % (ref 36.0–46.0)
Hemoglobin: 13.1 g/dL (ref 12.0–15.0)
MCH: 27.8 pg (ref 26.0–34.0)
MCHC: 32.4 g/dL (ref 30.0–36.0)
MCV: 85.8 fL (ref 80.0–100.0)
Platelets: 251 10*3/uL (ref 150–400)
RBC: 4.71 MIL/uL (ref 3.87–5.11)
RDW: 14.2 % (ref 11.5–15.5)
WBC: 10.5 10*3/uL (ref 4.0–10.5)
nRBC: 0 % (ref 0.0–0.2)

## 2020-11-02 LAB — COMPREHENSIVE METABOLIC PANEL
ALT: 21 U/L (ref 0–44)
AST: 22 U/L (ref 15–41)
Albumin: 3.9 g/dL (ref 3.5–5.0)
Alkaline Phosphatase: 53 U/L (ref 38–126)
Anion gap: 9 (ref 5–15)
BUN: 11 mg/dL (ref 8–23)
CO2: 29 mmol/L (ref 22–32)
Calcium: 9.9 mg/dL (ref 8.9–10.3)
Chloride: 100 mmol/L (ref 98–111)
Creatinine, Ser: 0.79 mg/dL (ref 0.44–1.00)
GFR, Estimated: >60 mL/min (ref >60)
Glucose, Bld: 98 mg/dL (ref 70–99)
Potassium: 2.9 mmol/L — ABNORMAL LOW (ref 3.5–5.1)
Sodium: 138 mmol/L (ref 135–145)
Total Bilirubin: 0.3 mg/dL (ref 0.3–1.2)
Total Protein: 6.9 g/dL (ref 6.5–8.1)

## 2020-11-02 LAB — SURGICAL PCR SCREEN
MRSA, PCR: NEGATIVE
Staphylococcus aureus: NEGATIVE

## 2020-11-02 LAB — SARS CORONAVIRUS 2 (TAT 6-24 HRS): SARS Coronavirus 2: NEGATIVE

## 2020-11-02 LAB — HEMOGLOBIN A1C
Hgb A1c MFr Bld: 5.8 % — ABNORMAL HIGH (ref 4.8–5.6)
Mean Plasma Glucose: 119.76 mg/dL

## 2020-11-02 LAB — GLUCOSE, CAPILLARY: Glucose-Capillary: 135 mg/dL — ABNORMAL HIGH (ref 70–99)

## 2020-11-02 NOTE — Progress Notes (Signed)
Potassium level: 2.9.  Sent message to Dr. Ophelia Charter Chart referred to anesthesia.

## 2020-11-02 NOTE — Telephone Encounter (Signed)
Patient came in to office after not being able to [employer] on the phone. Per pt, tried to call 7 times and no answer and left msgs with no return call so she came in to check on FMLA forms which were due yesterday. They had been completed and pending signature. I had forms signed and gave pt copy and ensured pt that I would fax immediately. Told her if she needed anything else regarding forms to call me. Forms faxed to [employer], employer attn: Saddie Benders [phone]

## 2020-11-02 NOTE — Progress Notes (Addendum)
PCP - Edd Fabian, FNP @ [phone] Internal Medicine - [facility]; records requested Cardiologist - Denies  PPM/ICD - Denies  Chest x-ray - N/A EKG - 11/02/20 Stress Test - per pt, ~ 3 years ago @ [facility]; records requested ECHO - 08/31/03 @ [facility]; records requested Cardiac Cath - Denies  Sleep Study - Yes CPAP - Yes, nightly  DM- Per pt, she is pre-diabetic; Checks CBG every weekend. FBG range 109-120. A1C obtained  Blood Thinner Instructions: N/A Aspirin Instructions: N/A  ERAS Protcol - Yes PRE-SURGERY Ensure or G2- G2 Given  COVID TEST- 11/02/20; results pending   Anesthesia review: Yes, abnormal EKG, review ECHO, stress test and last PCP office note and labs.  Patient denies shortness of breath, fever, cough and chest pain at PAT appointment   All instructions explained to the patient, with a verbal understanding of the material. Patient agrees to go over the instructions while at home for a better understanding. The opportunity to ask questions was provided.

## 2020-11-03 ENCOUNTER — Other Ambulatory Visit: Payer: Self-pay | Admitting: Orthopaedic Surgery

## 2020-11-03 MED ORDER — POTASSIUM CHLORIDE CRYS ER 20 MEQ PO TBCR: 20.0000 meq | EXTENDED_RELEASE_TABLET | Freq: Two times a day (BID) | ORAL | 0 refills | Status: AC

## 2020-11-03 NOTE — Progress Notes (Addendum)
Anesthesia Chart Review:  Case: [id number] Date/Time: 11/06/20 1215   Procedure: RIGHT TOTAL KNEE ARTHROPLASTY (Right: Knee)   Anesthesia type: Spinal   Pre-op diagnosis: right knee osteoarthritis   [facility] RO[facility] / [facility]   Surgeons: Eldred Manges, MD       DISCUSSION: Patient is a 65 year old female scheduled for the above procedure.  History includes never smoker, HTN, hypothyroidism, TIA (2010), OSA (uses CPAP), asthma, GERD, TKA (left (05/11/08), left total shoulder (07/25/11). BMI is consistent with obesity.   11/02/2020 presurgical COVID-19 test was negative.  Anesthesia team to evaluate on the day of surgery. Plan ISTAT8 on arrival to re-evaluate hypokalemia. (UPDATE: Dr. Ophelia Charter to call her in oral KCL on 11/03/20.)   VS: BP 123/78   Pulse 98   Temp 36.8 C (Oral)   Resp 17   Ht 5\' 4"  (1.626 m)   Wt 93.4 kg   SpO2 100%   BMI 35.36 kg/m   PROVIDERS: , FNP is PCP [facility] [place]. PAT RN requested records, but still pending. [facility], MD is endocrinologist - She had a cardiology evaluation by Dorisann Frames, MD in 2014 at [facility] (I believe, now a [facility])   LABS: Preoperative labs noted. K 2.9. She is on losartan-HCTZ. Result communicated to [facility] at Dr. [facility]' office. Defer supplementation recommendations to surgeon. Will order ISTAT for day of surgery to recheck. (all labs ordered are listed, but only abnormal results are displayed)  Labs Reviewed  GLUCOSE, CAPILLARY - Abnormal; Notable for the following components:      Result Value   Glucose-Capillary 135 (*)    All other components within normal limits  COMPREHENSIVE METABOLIC PANEL - Abnormal; Notable for the following components:   Potassium 2.9 (*)    All other components within normal limits  HEMOGLOBIN A1C - Abnormal; Notable for the following components:   Hgb A1c MFr Bld 5.8 (*)    All other components within  normal limits  SARS CORONAVIRUS 2 (TAT 6-24 HRS)  SURGICAL PCR SCREEN  CBC     IMAGES: MRI C-spine 06/17/20: IMPRESSION: 1. 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 There is a degenerative reversal of cervical lordosis with mild C7-T1 anterolisthesis that is progressive. 2. No high-grade foraminal narrowing.     EKG: 11/02/20: Sinus rhythm with sinus arrhythmia Minimal voltage criteria for LVH, may be normal variant ( R in aVL ) Borderline ECG No significant change since last tracing Confirmed by 11/04/20 [phone]) on 11/03/2020 11:14:36 AM   CV: Stress Cardiolite 02/25/2011 [facility]; scanned under Media Tab, Correspondence 08/01/12): ETT 5 minutes and 30 seconds achieving a workload of 7 METS. 1.  Normal study.  Exercise Cardiolite stress test with myocardial SPECT imaging achieving 95% of the age-predicted maximal heart rate does not show any evidence of ischemia or infarction by nuclear criteria. 2.  Normal left ventricular systolic function. 3.  There was a hypertensive response to exercise.   Echo 02/25/2011 [facility]; scanned under Media Tab, Correspondence 08/01/12): LVEF 55 to 60%.  Mild concentric LVH.  RV is normal in size and function.  Mild aortic valve sclerosis.  AV is trileaflet and mildly thickened.  Trace MR.  Mitral valve leaflets mildly thickened.  Mild mitral annular calcification present.  Trace to mild TR.  RVSP normal at less than 35 mmHg.   Holter monitor May 2007 (as outlined  in 06/30/12 cardiology note by Dr. Valla Leaver; scanned under Media Tab, Correspondence 08/01/12): The average heart rate was 95 beats minute.  The slowest heart rate was 59 beats a minute.  The fastest heart rate was 164 beats a minute.  The patient had occasional ventricular ectopic beats, predominantly all of which were isolated premature ventricular beats.  There were no runs of  ventricular tachycardia.  There were rare supraventricular ectopic beats; 23 were recorded during the 24 hours of this Holter examination, all of which were isolated beats.  The longest R-R interval was 1.9 seconds.  No symptoms were recorded during the 24 hours of this Holter examination.  Past Medical History:  Diagnosis Date   Arthritis    Asthma    Chronic pain    Contact lens/glasses fitting    wears contacts or glasses   GERD (gastroesophageal reflux disease)    Hypertension    Hypothyroidism    Insomnia    Mini stroke (HCC)    ~2010   Seasonal allergies    Sleep apnea    CPAP @ night   Wears partial dentures    top and bottom partials    Past Surgical History:  Procedure Laterality Date   ABDOMINAL HYSTERECTOMY     CHOLECYSTECTOMY     KNEE ARTHROSCOPY Left 04/09/2007   KNEE ARTHROSCOPY WITH MEDIAL MENISECTOMY Right 07/30/2012   Procedure: KNEE ARTHROSCOPY WITH MEDIAL AND LATERAL MENISECTOMIES, CHONDROPLASTY OF PATELLA;  Surgeon: Loreta Ave, MD;  [facility];  Service: Orthopedics;  Laterality: Right;   SHOULDER ARTHROSCOPY  09/12/2011   right   TONSILLECTOMY     TOTAL KNEE ARTHROPLASTY Left 05/11/2008   TOTAL SHOULDER ARTHROPLASTY  07/25/2011   Procedure: TOTAL SHOULDER ARTHROPLASTY;  Surgeon: Loreta Ave, MD;  Location: [facility];  Service: Orthopedics;  Laterality: Left;    MEDICATIONS:  albuterol (VENTOLIN HFA) 108 (90 Base) MCG/ACT inhaler   aspirin-sod bicarb-citric acid (ALKA-SELTZER) 325 MG TBEF tablet   Boswellia-Glucosamine-Vit D (OSTEO BI-FLEX ONE PER DAY PO)   CALCIUM PO   cetirizine (ZYRTEC) 10 MG tablet   diclofenac Sodium (VOLTAREN) 1 % GEL   Dulaglutide 0.75 MG/0.5ML SOPN   gabapentin (NEURONTIN) 100 MG capsule   ibuprofen (ADVIL) 200 MG tablet   losartan-hydrochlorothiazide (HYZAAR) 100-25 MG tablet   MAGNESIUM PO   Multiple Vitamins-Minerals (HAIR SKIN AND NAILS FORMULA) TABS   Multiple  Vitamins-Minerals (MULTIVITAMIN WITH MINERALS) tablet   oxyCODONE-acetaminophen (PERCOCET) 10-325 MG tablet   XIIDRA 5 % SOLN   No current facility-administered medications for this encounter.    Shonna Chock, PA-C Surgical Short Stay/Anesthesiology [facility] Phone [phone] [facility] Phone [phone] 11/03/2020 12:24 PM

## 2020-11-03 NOTE — Anesthesia Preprocedure Evaluation (Addendum)
Anesthesia Evaluation  Patient identified by MRN, date of birth, ID band Patient awake    Reviewed: Allergy & Precautions, NPO status , Patient's Chart, lab work & pertinent test results  Airway Mallampati: II  TM Distance: >3 FB Neck ROM: Full    Dental  (+) Dental Advisory Given, Partial Upper, Partial Lower   Pulmonary asthma , sleep apnea and Continuous Positive Airway Pressure Ventilation ,    breath sounds clear to auscultation       Cardiovascular hypertension, Pt. on medications  Rhythm:Regular Rate:Normal     Neuro/Psych 2010  Neuromuscular disease CVA, No Residual Symptoms negative psych ROS   GI/Hepatic Neg liver ROS, GERD  Medicated and Controlled,  Endo/Other  Hypothyroidism Obesity  Renal/GU Hypokalemia  negative genitourinary   Musculoskeletal  (+) Arthritis ,   Abdominal (+) + obese,   Peds  Hematology negative hematology ROS (+)   Anesthesia Other Findings   Reproductive/Obstetrics                           Lab Results  Component Value Date   WBC 10.5 11/02/2020   HGB 13.1 11/02/2020   HCT 40.4 11/02/2020   MCV 85.8 11/02/2020   PLT 251 11/02/2020   Lab Results  Component Value Date   CREATININE 0.79 11/02/2020   BUN 11 11/02/2020   NA 138 11/02/2020   K 2.9 (L) 11/02/2020   CL 100 11/02/2020   CO2 29 11/02/2020    Anesthesia Physical Anesthesia Plan  ASA: 2  Anesthesia Plan: Spinal   Post-op Pain Management:  Regional for Post-op pain   Induction:   PONV Risk Score and Plan: 3 and Treatment may vary due to age or medical condition, Propofol infusion, Ondansetron and Midazolam  Airway Management Planned: Natural Airway and Simple Face Mask  Additional Equipment:   Intra-op Plan:   Post-operative Plan:   Informed Consent: I have reviewed the patients History and Physical, chart, labs and discussed the procedure including the risks, benefits and  alternatives for the proposed anesthesia with the patient or authorized representative who has indicated his/her understanding and acceptance.     Dental advisory given  Plan Discussed with: CRNA and Anesthesiologist  Anesthesia Plan Comments: (PAT note written 11/03/2020 by Shonna Chock, PA-C. )      Anesthesia Quick Evaluation

## 2020-11-06 ENCOUNTER — Observation Stay (HOSPITAL_COMMUNITY): Payer: BC Managed Care – PPO

## 2020-11-06 ENCOUNTER — Encounter (HOSPITAL_COMMUNITY): Payer: Self-pay | Admitting: Orthopaedic Surgery

## 2020-11-06 ENCOUNTER — Ambulatory Visit (HOSPITAL_COMMUNITY): Payer: BC Managed Care – PPO | Admitting: Vascular Surgery

## 2020-11-06 ENCOUNTER — Observation Stay (HOSPITAL_COMMUNITY)
Admission: RE | Admit: 2020-11-06 | Discharge: 2020-11-08 | Disposition: A | Discharge status: Home / Self Care | Payer: BC Managed Care – PPO | Attending: Orthopaedic Surgery | Admitting: Orthopaedic Surgery

## 2020-11-06 ENCOUNTER — Other Ambulatory Visit: Payer: Self-pay

## 2020-11-06 ENCOUNTER — Encounter (HOSPITAL_COMMUNITY)
Admission: RE | Disposition: A | Discharge status: Home / Self Care | Payer: Self-pay | Source: Home / Self Care | Attending: Orthopaedic Surgery

## 2020-11-06 DIAGNOSIS — J45909 Unspecified asthma, uncomplicated: Secondary | ICD-10-CM | POA: Insufficient documentation

## 2020-11-06 DIAGNOSIS — Z23 Encounter for immunization: Secondary | ICD-10-CM | POA: Diagnosis not present

## 2020-11-06 DIAGNOSIS — Z79899 Other long term (current) drug therapy: Secondary | ICD-10-CM | POA: Diagnosis not present

## 2020-11-06 DIAGNOSIS — M1711 Unilateral primary osteoarthritis, right knee: Secondary | ICD-10-CM | POA: Diagnosis present

## 2020-11-06 DIAGNOSIS — Z7982 Long term (current) use of aspirin: Secondary | ICD-10-CM | POA: Insufficient documentation

## 2020-11-06 DIAGNOSIS — Z09 Encounter for follow-up examination after completed treatment for conditions other than malignant neoplasm: Secondary | ICD-10-CM

## 2020-11-06 DIAGNOSIS — Z96652 Presence of left artificial knee joint: Secondary | ICD-10-CM | POA: Insufficient documentation

## 2020-11-06 DIAGNOSIS — Z96612 Presence of left artificial shoulder joint: Secondary | ICD-10-CM | POA: Diagnosis not present

## 2020-11-06 DIAGNOSIS — E039 Hypothyroidism, unspecified: Secondary | ICD-10-CM | POA: Insufficient documentation

## 2020-11-06 DIAGNOSIS — I10 Essential (primary) hypertension: Secondary | ICD-10-CM | POA: Insufficient documentation

## 2020-11-06 HISTORY — PX: TOTAL KNEE ARTHROPLASTY: SHX125

## 2020-11-06 LAB — GLUCOSE, CAPILLARY
Glucose-Capillary: 85 mg/dL (ref 70–99)
Glucose-Capillary: 88 mg/dL (ref 70–99)
Glucose-Capillary: 148 mg/dL — ABNORMAL HIGH (ref 70–99)

## 2020-11-06 LAB — POCT I-STAT, CHEM 8
BUN: 11 mg/dL (ref 8–23)
Calcium, Ion: 1.25 mmol/L (ref 1.15–1.40)
Chloride: 101 mmol/L (ref 98–111)
Creatinine, Ser: 0.8 mg/dL (ref 0.44–1.00)
Glucose, Bld: 107 mg/dL — ABNORMAL HIGH (ref 70–99)
HCT: 44 % (ref 36.0–46.0)
Hemoglobin: 15 g/dL (ref 12.0–15.0)
Potassium: 4.2 mmol/L (ref 3.5–5.1)
Sodium: 139 mmol/L (ref 135–145)
TCO2: 27 mmol/L (ref 22–32)

## 2020-11-06 SURGERY — ARTHROPLASTY, KNEE, TOTAL
Anesthesia: Spinal | Site: Knee | Laterality: Right

## 2020-11-06 MED ORDER — BUPIVACAINE LIPOSOME 1.3 % IJ SUSP
INTRAMUSCULAR | Status: AC
  Filled 2020-11-06: qty 20

## 2020-11-06 MED ORDER — CEFAZOLIN SODIUM-DEXTROSE 2-4 GM/100ML-% IV SOLN
2.0000 g | INTRAVENOUS | Status: AC
  Administered 2020-11-06: 2 g via INTRAVENOUS
  Filled 2020-11-06: qty 100

## 2020-11-06 MED ORDER — ALBUTEROL SULFATE (2.5 MG/3ML) 0.083% IN NEBU: 2.5000 mg | INHALATION_SOLUTION | Freq: Four times a day (QID) | RESPIRATORY_TRACT | Status: DC | PRN

## 2020-11-06 MED ORDER — LOSARTAN POTASSIUM 50 MG PO TABS
100.0000 mg | ORAL_TABLET | Freq: Every day | ORAL | Status: DC
  Administered 2020-11-07 – 2020-11-08 (×2): 100 mg via ORAL
  Filled 2020-11-06 (×2): qty 2

## 2020-11-06 MED ORDER — ORAL CARE MOUTH RINSE: 15.0000 mL | Freq: Once | OROMUCOSAL | Status: AC

## 2020-11-06 MED ORDER — PROPOFOL 500 MG/50ML IV EMUL
INTRAVENOUS | Status: DC | PRN
  Administered 2020-11-06: 100 ug/kg/min via INTRAVENOUS

## 2020-11-06 MED ORDER — METOCLOPRAMIDE HCL 5 MG/ML IJ SOLN: 5.0000 mg | Freq: Three times a day (TID) | INTRAMUSCULAR | Status: DC | PRN

## 2020-11-06 MED ORDER — BUPIVACAINE HCL (PF) 0.25 % IJ SOLN
INTRAMUSCULAR | Status: AC
  Filled 2020-11-06: qty 30

## 2020-11-06 MED ORDER — BUPIVACAINE HCL (PF) 0.25 % IJ SOLN
INTRAMUSCULAR | Status: DC | PRN
  Administered 2020-11-06: 20 mL

## 2020-11-06 MED ORDER — METHOCARBAMOL 500 MG PO TABS
500.0000 mg | ORAL_TABLET | Freq: Four times a day (QID) | ORAL | Status: DC | PRN
  Administered 2020-11-07 – 2020-11-08 (×4): 500 mg via ORAL
  Filled 2020-11-06 (×4): qty 1

## 2020-11-06 MED ORDER — BUPIVACAINE IN DEXTROSE 0.75-8.25 % IT SOLN
INTRATHECAL | Status: DC | PRN
  Administered 2020-11-06: 1.6 mL via INTRATHECAL

## 2020-11-06 MED ORDER — FENTANYL CITRATE (PF) 100 MCG/2ML IJ SOLN: 100.0000 ug | Freq: Once | INTRAMUSCULAR | Status: AC

## 2020-11-06 MED ORDER — DEXAMETHASONE SODIUM PHOSPHATE 10 MG/ML IJ SOLN
INTRAMUSCULAR | Status: DC | PRN
Start: 2020-11-06 — End: 2020-11-06
  Administered 2020-11-06: 5 mg via INTRAVENOUS

## 2020-11-06 MED ORDER — INFLUENZA VAC A&B SA ADJ QUAD 0.5 ML IM PRSY
0.5000 mL | PREFILLED_SYRINGE | INTRAMUSCULAR | Status: AC
  Administered 2020-11-08: 0.5 mL via INTRAMUSCULAR
  Filled 2020-11-06 (×2): qty 0.5

## 2020-11-06 MED ORDER — LACTATED RINGERS IV SOLN
INTRAVENOUS | Status: DC
Start: 2020-11-06 — End: 2020-11-06

## 2020-11-06 MED ORDER — POLYETHYLENE GLYCOL 3350 17 G PO PACK: 17.0000 g | PACK | Freq: Every day | ORAL | Status: DC | PRN

## 2020-11-06 MED ORDER — BUPIVACAINE LIPOSOME 1.3 % IJ SUSP
INTRAMUSCULAR | Status: DC | PRN
  Administered 2020-11-06: 20 mL

## 2020-11-06 MED ORDER — SODIUM CHLORIDE 0.9 % IV SOLN: INTRAVENOUS | Status: DC

## 2020-11-06 MED ORDER — HYDROMORPHONE HCL 1 MG/ML IJ SOLN: 0.2500 mg | INTRAMUSCULAR | Status: DC | PRN

## 2020-11-06 MED ORDER — CHLORHEXIDINE GLUCONATE 0.12 % MT SOLN
15.0000 mL | Freq: Once | OROMUCOSAL | Status: AC
  Administered 2020-11-06: 15 mL via OROMUCOSAL
  Filled 2020-11-06: qty 15

## 2020-11-06 MED ORDER — POTASSIUM CHLORIDE CRYS ER 20 MEQ PO TBCR
20.0000 meq | EXTENDED_RELEASE_TABLET | Freq: Two times a day (BID) | ORAL | Status: DC
  Administered 2020-11-07 – 2020-11-08 (×3): 20 meq via ORAL
  Filled 2020-11-06 (×3): qty 1

## 2020-11-06 MED ORDER — BUPIVACAINE LIPOSOME 1.3 % IJ SUSP
10.0000 mL | Freq: Once | INTRAMUSCULAR | Status: DC
  Filled 2020-11-06: qty 10

## 2020-11-06 MED ORDER — TRANEXAMIC ACID-NACL 1000-0.7 MG/100ML-% IV SOLN
INTRAVENOUS | Status: AC
  Filled 2020-11-06: qty 100

## 2020-11-06 MED ORDER — OXYCODONE HCL 5 MG/5ML PO SOLN
5.0000 mg | Freq: Once | ORAL | Status: DC | PRN
Start: 2020-11-06 — End: 2020-11-06

## 2020-11-06 MED ORDER — ACETAMINOPHEN 325 MG PO TABS: 325.0000 mg | ORAL_TABLET | Freq: Four times a day (QID) | ORAL | Status: DC | PRN

## 2020-11-06 MED ORDER — ONDANSETRON HCL 4 MG PO TABS: 4.0000 mg | ORAL_TABLET | Freq: Four times a day (QID) | ORAL | Status: DC | PRN

## 2020-11-06 MED ORDER — ONDANSETRON HCL 4 MG/2ML IJ SOLN: 4.0000 mg | Freq: Four times a day (QID) | INTRAMUSCULAR | Status: DC | PRN

## 2020-11-06 MED ORDER — TRANEXAMIC ACID-NACL 1000-0.7 MG/100ML-% IV SOLN
1000.0000 mg | Freq: Once | INTRAVENOUS | Status: AC
  Administered 2020-11-06: 1000 mg via INTRAVENOUS

## 2020-11-06 MED ORDER — FENTANYL CITRATE (PF) 250 MCG/5ML IJ SOLN
INTRAMUSCULAR | Status: AC
  Filled 2020-11-06: qty 5

## 2020-11-06 MED ORDER — LOSARTAN POTASSIUM-HCTZ 100-25 MG PO TABS: 1.0000 | ORAL_TABLET | Freq: Every day | ORAL | Status: DC

## 2020-11-06 MED ORDER — PROPOFOL 500 MG/50ML IV EMUL
INTRAVENOUS | Status: AC
  Filled 2020-11-06: qty 50

## 2020-11-06 MED ORDER — ONDANSETRON HCL 4 MG/2ML IJ SOLN: 4.0000 mg | Freq: Once | INTRAMUSCULAR | Status: DC | PRN

## 2020-11-06 MED ORDER — HYDROCHLOROTHIAZIDE 25 MG PO TABS
25.0000 mg | ORAL_TABLET | Freq: Every day | ORAL | Status: DC
  Administered 2020-11-07 – 2020-11-08 (×2): 25 mg via ORAL
  Filled 2020-11-06 (×2): qty 1

## 2020-11-06 MED ORDER — METOCLOPRAMIDE HCL 5 MG PO TABS: 5.0000 mg | ORAL_TABLET | Freq: Three times a day (TID) | ORAL | Status: DC | PRN

## 2020-11-06 MED ORDER — MIDAZOLAM HCL 2 MG/2ML IJ SOLN
INTRAMUSCULAR | Status: AC
  Administered 2020-11-06: 2 mg via INTRAVENOUS
  Filled 2020-11-06: qty 2

## 2020-11-06 MED ORDER — OXYCODONE HCL 5 MG PO TABS
5.0000 mg | ORAL_TABLET | ORAL | Status: DC | PRN
  Administered 2020-11-06 – 2020-11-08 (×7): 10 mg via ORAL
  Filled 2020-11-06 (×7): qty 2

## 2020-11-06 MED ORDER — METHOCARBAMOL 1000 MG/10ML IJ SOLN
500.0000 mg | Freq: Four times a day (QID) | INTRAVENOUS | Status: DC | PRN
  Filled 2020-11-06: qty 5

## 2020-11-06 MED ORDER — INSULIN ASPART 100 UNIT/ML IJ SOLN
0.0000 [IU] | Freq: Three times a day (TID) | INTRAMUSCULAR | Status: DC
  Administered 2020-11-07: 3 [IU] via SUBCUTANEOUS
  Administered 2020-11-07: 4 [IU] via SUBCUTANEOUS
  Administered 2020-11-07: 3 [IU] via SUBCUTANEOUS

## 2020-11-06 MED ORDER — DOCUSATE SODIUM 100 MG PO CAPS
100.0000 mg | ORAL_CAPSULE | Freq: Two times a day (BID) | ORAL | Status: DC
  Administered 2020-11-06 – 2020-11-08 (×4): 100 mg via ORAL
  Filled 2020-11-06 (×4): qty 1

## 2020-11-06 MED ORDER — LIFITEGRAST 5 % OP SOLN: 1.0000 [drp] | Freq: Every day | OPHTHALMIC | Status: DC | PRN

## 2020-11-06 MED ORDER — MENTHOL 3 MG MT LOZG: 1.0000 | LOZENGE | OROMUCOSAL | Status: DC | PRN

## 2020-11-06 MED ORDER — BUPIVACAINE-EPINEPHRINE (PF) 0.5% -1:200000 IJ SOLN
INTRAMUSCULAR | Status: DC | PRN
  Administered 2020-11-06: 30 mL via PERINEURAL

## 2020-11-06 MED ORDER — HYDROMORPHONE HCL 1 MG/ML IJ SOLN
0.5000 mg | INTRAMUSCULAR | Status: DC | PRN
  Administered 2020-11-06 – 2020-11-07 (×6): 0.5 mg via INTRAVENOUS
  Filled 2020-11-06 (×6): qty 0.5

## 2020-11-06 MED ORDER — ASPIRIN EC 325 MG PO TBEC
325.0000 mg | DELAYED_RELEASE_TABLET | Freq: Every day | ORAL | Status: DC
  Administered 2020-11-07 – 2020-11-08 (×2): 325 mg via ORAL
  Filled 2020-11-06 (×2): qty 1

## 2020-11-06 MED ORDER — GABAPENTIN 100 MG PO CAPS
100.0000 mg | ORAL_CAPSULE | Freq: Every day | ORAL | Status: DC
  Administered 2020-11-06 – 2020-11-07 (×2): 100 mg via ORAL
  Filled 2020-11-06 (×2): qty 1

## 2020-11-06 MED ORDER — FENTANYL CITRATE (PF) 100 MCG/2ML IJ SOLN
INTRAMUSCULAR | Status: AC
  Administered 2020-11-06: 100 ug via INTRAVENOUS
  Filled 2020-11-06: qty 2

## 2020-11-06 MED ORDER — MIDAZOLAM HCL 2 MG/2ML IJ SOLN: 2.0000 mg | Freq: Once | INTRAMUSCULAR | Status: AC

## 2020-11-06 MED ORDER — ALBUTEROL SULFATE HFA 108 (90 BASE) MCG/ACT IN AERS: 2.0000 | INHALATION_SPRAY | Freq: Four times a day (QID) | RESPIRATORY_TRACT | Status: DC | PRN

## 2020-11-06 MED ORDER — PHENOL 1.4 % MT LIQD: 1.0000 | OROMUCOSAL | Status: DC | PRN

## 2020-11-06 MED ORDER — OXYCODONE HCL 5 MG PO TABS: 5.0000 mg | ORAL_TABLET | Freq: Once | ORAL | Status: DC | PRN

## 2020-11-06 MED ORDER — LORATADINE 10 MG PO TABS
10.0000 mg | ORAL_TABLET | Freq: Every day | ORAL | Status: DC
  Administered 2020-11-07 – 2020-11-08 (×2): 10 mg via ORAL
  Filled 2020-11-06 (×2): qty 1

## 2020-11-06 MED ORDER — DULAGLUTIDE 0.75 MG/0.5ML ~~LOC~~ SOAJ: 0.7500 mg | SUBCUTANEOUS | Status: DC

## 2020-11-06 MED ORDER — PROPOFOL 1000 MG/100ML IV EMUL
INTRAVENOUS | Status: AC
  Filled 2020-11-06: qty 100

## 2020-11-06 MED ORDER — PHENYLEPHRINE HCL-NACL 20-0.9 MG/250ML-% IV SOLN
INTRAVENOUS | Status: DC | PRN
  Administered 2020-11-06: 25 ug/min via INTRAVENOUS

## 2020-11-06 SURGICAL SUPPLY — 71 items
ATTUNE MED DOME PAT 32 KNEE (Knees) ×2 IMPLANT
ATTUNE PS FEM RT SZ 5 CEM KNEE (Femur) ×2 IMPLANT
ATTUNE PSRP INSR SZ5 6 KNEE (Insert) ×2 IMPLANT
BAG COUNTER SPONGE SURGICOUNT (BAG) ×2 IMPLANT
BANDAGE ESMARK 6X9 LF (GAUZE/BANDAGES/DRESSINGS) ×1 IMPLANT
BASE TIBIAL ROT PLAT SZ 5 KNEE (Knees) ×1 IMPLANT
BENZOIN TINCTURE PRP APPL 2/3 (GAUZE/BANDAGES/DRESSINGS) ×2 IMPLANT
BLADE SAGITTAL 25.0X1.19X90 (BLADE) ×2 IMPLANT
BLADE SAW SGTL 13X75X1.27 (BLADE) ×2 IMPLANT
BNDG ELASTIC 4X5.8 VLCR STR LF (GAUZE/BANDAGES/DRESSINGS) ×2 IMPLANT
BNDG ELASTIC 6X5.8 VLCR STR LF (GAUZE/BANDAGES/DRESSINGS) ×2 IMPLANT
BNDG ESMARK 6X9 LF (GAUZE/BANDAGES/DRESSINGS) ×2
BOWL SMART MIX CTS (DISPOSABLE) ×2 IMPLANT
CEMENT HV SMART SET (Cement) ×4 IMPLANT
COVER SURGICAL LIGHT HANDLE (MISCELLANEOUS) ×2 IMPLANT
CUFF TOURN SGL QUICK 34 (TOURNIQUET CUFF) ×1
CUFF TOURN SGL QUICK 42 (TOURNIQUET CUFF) IMPLANT
CUFF TRNQT CYL 34X4.125X (TOURNIQUET CUFF) ×1 IMPLANT
DRAPE ORTHO SPLIT 77X108 STRL (DRAPES) ×2
DRAPE SURG ORHT 6 SPLT 77X108 (DRAPES) ×2 IMPLANT
DRAPE U-SHAPE 47X51 STRL (DRAPES) ×2 IMPLANT
DRSG MEPILEX SACRM 8.7X9.8 (GAUZE/BANDAGES/DRESSINGS) ×2 IMPLANT
DRSG PAD ABDOMINAL 8X10 ST (GAUZE/BANDAGES/DRESSINGS) ×2 IMPLANT
DURAPREP 26ML APPLICATOR (WOUND CARE) ×4 IMPLANT
ELECT REM PT RETURN 9FT ADLT (ELECTROSURGICAL) ×2
ELECTRODE REM PT RTRN 9FT ADLT (ELECTROSURGICAL) ×1 IMPLANT
EVACUATOR 1/8 PVC DRAIN (DRAIN) IMPLANT
FACESHIELD WRAPAROUND (MASK) ×4 IMPLANT
GAUZE SPONGE 4X4 12PLY STRL (GAUZE/BANDAGES/DRESSINGS) ×2 IMPLANT
GAUZE XEROFORM 5X9 LF (GAUZE/BANDAGES/DRESSINGS) ×2 IMPLANT
GLOVE SRG 8 PF TXTR STRL LF DI (GLOVE) ×2 IMPLANT
GLOVE SURG ORTHO LTX SZ7.5 (GLOVE) ×4 IMPLANT
GLOVE SURG UNDER POLY LF SZ8 (GLOVE) ×2
GOWN STRL REUS W/ TWL LRG LVL3 (GOWN DISPOSABLE) ×1 IMPLANT
GOWN STRL REUS W/ TWL XL LVL3 (GOWN DISPOSABLE) ×1 IMPLANT
GOWN STRL REUS W/TWL 2XL LVL3 (GOWN DISPOSABLE) ×2 IMPLANT
GOWN STRL REUS W/TWL LRG LVL3 (GOWN DISPOSABLE) ×1
GOWN STRL REUS W/TWL XL LVL3 (GOWN DISPOSABLE) ×1
HANDPIECE INTERPULSE COAX TIP (DISPOSABLE) ×1
IMMOBILIZER KNEE 22 UNIV (SOFTGOODS) ×2 IMPLANT
KIT BASIN OR (CUSTOM PROCEDURE TRAY) ×2 IMPLANT
KIT TURNOVER KIT B (KITS) ×2 IMPLANT
MANIFOLD NEPTUNE II (INSTRUMENTS) ×2 IMPLANT
MARKER SKIN DUAL TIP RULER LAB (MISCELLANEOUS) ×2 IMPLANT
NEEDLE 18GX1X1/2 (RX/OR ONLY) (NEEDLE) ×2 IMPLANT
NEEDLE HYPO 25GX1X1/2 BEV (NEEDLE) ×2 IMPLANT
NS IRRIG 1000ML POUR BTL (IV SOLUTION) ×2 IMPLANT
PACK TOTAL JOINT (CUSTOM PROCEDURE TRAY) ×2 IMPLANT
PAD ARMBOARD 7.5X6 YLW CONV (MISCELLANEOUS) ×4 IMPLANT
PAD CAST 4YDX4 CTTN HI CHSV (CAST SUPPLIES) ×1 IMPLANT
PADDING CAST COTTON 4X4 STRL (CAST SUPPLIES) ×1
PADDING CAST COTTON 6X4 STRL (CAST SUPPLIES) ×2 IMPLANT
PIN DRILL FIX HALF THREAD (BIT) ×2 IMPLANT
PIN STEINMAN FIXATION KNEE (PIN) ×2 IMPLANT
SET HNDPC FAN SPRY TIP SCT (DISPOSABLE) ×1 IMPLANT
STAPLER VISISTAT 35W (STAPLE) IMPLANT
SUCTION FRAZIER HANDLE 10FR (MISCELLANEOUS) ×1
SUCTION TUBE FRAZIER 10FR DISP (MISCELLANEOUS) ×1 IMPLANT
SUT VIC AB 0 CT1 27 (SUTURE) ×1
SUT VIC AB 0 CT1 27XBRD ANBCTR (SUTURE) ×1 IMPLANT
SUT VIC AB 1 CTX 36 (SUTURE) ×2
SUT VIC AB 1 CTX36XBRD ANBCTR (SUTURE) ×2 IMPLANT
SUT VIC AB 2-0 CT1 27 (SUTURE) ×2
SUT VIC AB 2-0 CT1 TAPERPNT 27 (SUTURE) ×2 IMPLANT
SUT VIC AB 3-0 X1 27 (SUTURE) ×2 IMPLANT
SYR 50ML LL SCALE MARK (SYRINGE) ×2 IMPLANT
SYR CONTROL 10ML LL (SYRINGE) ×2 IMPLANT
TIBIAL BASE ROT PLAT SZ 5 KNEE (Knees) ×2 IMPLANT
TOWEL GREEN STERILE (TOWEL DISPOSABLE) ×2 IMPLANT
TOWEL GREEN STERILE FF (TOWEL DISPOSABLE) ×2 IMPLANT
TRAY CATH 16FR W/PLASTIC CATH (SET/KITS/TRAYS/PACK) IMPLANT

## 2020-11-06 NOTE — Interval H&P Note (Signed)
History and Physical Interval Note:  11/06/2020 12:26 PM  Mary Harvey  has presented today for surgery, with the diagnosis of right knee osteoarthritis.  The various methods of treatment have been discussed with the patient and family. After consideration of risks, benefits and other options for treatment, the patient has consented to  Procedure(s): RIGHT TOTAL KNEE ARTHROPLASTY (Right) as a surgical intervention.  The patient's history has been reviewed, patient examined, no change in status, stable for surgery.  I have reviewed the patient's chart and labs.  Questions were answered to the patient's satisfaction.     Eldred Manges

## 2020-11-06 NOTE — H&P (Signed)
TOTAL KNEE ADMISSION H&P  Patient is being admitted for right total knee arthroplasty.  Subjective:  Chief Complaint:right knee pain.  65 year old black female history of end-stage DJD right knee and pain comes in for preop evaluation.  States that symptoms unchanged from previous visit.  She is wanting to proceed with right total knee replacement as scheduled.  Today history and physical performed.  Review of systems negative.  Patient states that she does have a history of sleep apnea and uses CPAP.  Surgical procedure discussed along with potential hospital stay.  All questions answered.  States that she is familiar with what to expect since she had left total knee replacement performed by Dr. Eulah Pont in 2010.  HPI: Mary Harvey, 66 y.o. female, has a history of pain and functional disability in the right knee due to arthritis and has failed non-surgical conservative treatments for greater than 12 weeks to includeNSAID's and/or analgesics, corticosteriod injections, use of assistive devices, weight reduction as appropriate, and activity modification.  Onset of symptoms was gradual, starting 10 years ago with gradually worsening course since that time.   Patient currently rates pain in the right knee(s) at 10 out of 10 with activity. Patient has night pain, worsening of pain with activity and weight bearing, pain with passive range of motion, crepitus, and joint swelling.  Patient has evidence of subchondral cysts, subchondral sclerosis, periarticular osteophytes, and joint space narrowing by imaging studies. . There is no active infection.  Patient Active Problem List   Diagnosis Date Noted   Other spondylosis with radiculopathy, cervical region 01/13/2019   Chronic neck pain 12/22/2018   History of total knee arthroplasty, left 07/21/2018   Unilateral primary osteoarthritis, right knee 07/21/2018   Past Medical History:  Diagnosis Date   Arthritis    Asthma    Chronic pain    Contact  lens/glasses fitting    wears contacts or glasses   GERD (gastroesophageal reflux disease)    Hypertension    Hypothyroidism    Insomnia    Mini stroke (HCC)    ~2010   Seasonal allergies    Sleep apnea    CPAP @ night   Wears partial dentures    top and bottom partials    Past Surgical History:  Procedure Laterality Date   ABDOMINAL HYSTERECTOMY     CHOLECYSTECTOMY     KNEE ARTHROSCOPY Left 04/09/2007   KNEE ARTHROSCOPY WITH MEDIAL MENISECTOMY Right 07/30/2012   Procedure: KNEE ARTHROSCOPY WITH MEDIAL AND LATERAL MENISECTOMIES, CHONDROPLASTY OF PATELLA;  Surgeon: Loreta Ave, MD;  Location: [facility];  Service: Orthopedics;  Laterality: Right;   SHOULDER ARTHROSCOPY  09/12/2011   right   TONSILLECTOMY     TOTAL KNEE ARTHROPLASTY Left 05/11/2008   TOTAL SHOULDER ARTHROPLASTY  07/25/2011   Procedure: TOTAL SHOULDER ARTHROPLASTY;  Surgeon: Loreta Ave, MD;  Location: [facility];  Service: Orthopedics;  Laterality: Left;    No current facility-administered medications for this encounter.   Current Outpatient Medications  Medication Sig Dispense Refill Last Dose   albuterol (VENTOLIN HFA) 108 (90 Base) MCG/ACT inhaler Inhale 2 puffs into the lungs every 6 (six) hours as needed for wheezing or shortness of breath.      aspirin-sod bicarb-citric acid (ALKA-SELTZER) 325 MG TBEF tablet Take 650 mg by mouth every 6 (six) hours as needed (indigestion).      Boswellia-Glucosamine-Vit D (OSTEO BI-FLEX ONE PER DAY PO) Take 1 tablet by mouth daily.  CALCIUM PO Take 1 tablet by mouth daily.      cetirizine (ZYRTEC) 10 MG tablet Take 10 mg by mouth daily as needed for allergies.      diclofenac Sodium (VOLTAREN) 1 % GEL Apply 1 application topically 4 (four) times daily as needed (pain).      Dulaglutide 0.75 MG/0.5ML SOPN Inject 0.75 mg into the skin once a week.      gabapentin (NEURONTIN) 100 MG capsule Take 100 mg by mouth at bedtime.       ibuprofen (ADVIL) 200 MG tablet Take 200 mg by mouth daily as needed for moderate pain.      losartan-hydrochlorothiazide (HYZAAR) 100-25 MG tablet Take 1 tablet by mouth daily.      MAGNESIUM PO Take 2 tablets by mouth daily.      Multiple Vitamins-Minerals (HAIR SKIN AND NAILS FORMULA) TABS Take 1 tablet by mouth daily.      Multiple Vitamins-Minerals (MULTIVITAMIN WITH MINERALS) tablet Take 2 tablets by mouth daily.      oxyCODONE-acetaminophen (PERCOCET) 10-325 MG tablet Take 1 tablet by mouth every 6 (six) hours as needed for pain.      XIIDRA 5 % SOLN Place 1 drop into both eyes daily as needed (dry eyes).      potassium chloride SA (KLOR-CON) 20 MEQ tablet Take 1 tablet (20 mEq total) by mouth 2 (two) times daily. 40 tablet 0    Allergies  Allergen Reactions   Mobic [Meloxicam] Shortness Of Breath   Metformin Hcl     Constipation     Social History   Tobacco Use   Smoking status: Never   Smokeless tobacco: Never  Substance Use Topics   Alcohol use: Yes    Comment: occasionally    No family history on file.   Review of Systems  Constitutional: Negative.   HENT: Negative.    Eyes: Negative.   Respiratory:  Positive for apnea.   Cardiovascular: Negative.   Gastrointestinal: Negative.   Genitourinary: Negative.   Musculoskeletal:  Positive for gait problem and joint swelling.  Psychiatric/Behavioral: Negative.     Objective:  Physical Exam HENT:     Head: Normocephalic and atraumatic.     Nose: Nose normal.  Eyes:     Extraocular Movements: Extraocular movements intact.  Cardiovascular:     Rate and Rhythm: Regular rhythm.     Heart sounds: Normal heart sounds.  Pulmonary:     Effort: Pulmonary effort is normal. No respiratory distress.     Breath sounds: Normal breath sounds.  Abdominal:     General: Bowel sounds are normal. There is no distension.  Musculoskeletal:        General: Swelling and tenderness present.  Neurological:     General: No focal  deficit present.     Mental Status: She is alert and oriented to person, place, and time.  Psychiatric:        Mood and Affect: Mood normal.    Vital signs in last 24 hours:    Labs:   Estimated body mass index is 35.36 kg/m as calculated from the following:   Height as of 11/02/20: 5\' 4"  (1.626 m).   Weight as of 11/02/20: 93.4 kg.   Imaging Review Plain radiographs demonstrate moderate degenerative joint disease of the right knee(s). The overall alignment ismild varus. The bone quality appears to be good for age and reported activity level.      Assessment/Plan:  End stage arthritis, right knee   The patient  history, physical examination, clinical judgment of the provider and imaging studies are consistent with end stage degenerative joint disease of the right knee(s) and total knee arthroplasty is deemed medically necessary. The treatment options including medical management, injection therapy arthroscopy and arthroplasty were discussed at length. The risks and benefits of total knee arthroplasty were presented and reviewed. The risks due to aseptic loosening, infection, stiffness, patella tracking problems, thromboembolic complications and other imponderables were discussed. The patient acknowledged the explanation, agreed to proceed with the plan and consent was signed. Patient is being admitted for inpatient treatment for surgery, pain control, PT, OT, prophylactic antibiotics, VTE prophylaxis, progressive ambulation and ADL's and discharge planning. The patient is planning to be discharged home with home health services     Patient's anticipated LOS is less than 2 midnights, meeting these requirements: - Younger than 53 - Lives within 1 hour of care - Has a competent adult at home to recover with post-op recover - NO history of  - Chronic pain requiring opiods  - Diabetes  - Coronary Artery Disease  - Heart failure  - Heart attack  - Stroke  - DVT/VTE  - Cardiac  arrhythmia  - Respiratory Failure/COPD  - Renal failure  - Anemia  - Advanced Liver disease

## 2020-11-06 NOTE — Transfer of Care (Signed)
Immediate Anesthesia Transfer of Care Note  Patient: Mary Harvey  Procedure(s) Performed: RIGHT TOTAL KNEE ARTHROPLASTY (Right: Knee)  Patient Location: PACU  Anesthesia Type:MAC combined with regional for post-op pain  Level of Consciousness: awake and alert   Airway & Oxygen Therapy: Patient Spontanous Breathing  Post-op Assessment: Report given to RN and Post -op Vital signs reviewed and stable  Post vital signs: Reviewed  Last Vitals:  Vitals Value Taken Time  BP 124/99 11/06/20 1457  Temp    Pulse 90 11/06/20 1459  Resp 19 11/06/20 1459  SpO2 99 % 11/06/20 1459  Vitals shown include unvalidated device data.  Last Pain:  Vitals:   11/06/20 1029  TempSrc:   PainSc: 3       Patients Stated Pain Goal: 2 (03/54/65 6812)  Complications: No notable events documented.

## 2020-11-06 NOTE — Anesthesia Postprocedure Evaluation (Signed)
Anesthesia Post Note  Patient: Mary Harvey  Procedure(s) Performed: RIGHT TOTAL KNEE ARTHROPLASTY (Right: Knee)     Patient location during evaluation: PACU Anesthesia Type: Spinal Level of consciousness: oriented and awake and alert Pain management: pain level controlled Vital Signs Assessment: post-procedure vital signs reviewed and stable Respiratory status: spontaneous breathing, respiratory function stable and nonlabored ventilation Cardiovascular status: blood pressure returned to baseline and stable Postop Assessment: no headache, no backache, no apparent nausea or vomiting, spinal receding and patient able to bend at knees Anesthetic complications: no   No notable events documented.  Last Vitals:  Vitals:   11/06/20 1232 11/06/20 1456  BP: 127/67 (!) 124/99  Pulse: 77 96  Resp: 17 18  Temp:  36.7 C  SpO2: 100% 99%    Last Pain:  Vitals:   11/06/20 1456  TempSrc:   PainSc: 0-No pain                 Harinder Romas A.

## 2020-11-06 NOTE — Anesthesia Procedure Notes (Addendum)
Anesthesia Regional Block: Adductor canal block   Pre-Anesthetic Checklist: , timeout performed,  Correct Patient, Correct Site, Correct Laterality,  Correct Procedure, Correct Position, site marked,  Risks and benefits discussed,  Surgical consent,  Pre-op evaluation,  At surgeon's request and post-op pain management  Laterality: Right  Prep: chloraprep       Needles:  Injection technique: Single-shot  Needle Type: Echogenic Stimulator Needle     Needle Length: 10cm  Needle Gauge: 21   Needle insertion depth: 7 cm   Additional Needles:   Procedures:,,,, ultrasound used (permanent image in chart),,    Narrative:  Start time: 11/06/2020 12:30 PM End time: 11/06/2020 12:35 PM Injection made incrementally with aspirations every 5 mL.  Performed by: Personally  Anesthesiologist: Mal Amabile, MD  Additional Notes: Timeout performed. Patient sedated. Relevant anatomy ID'd using [place]. Incremental 2-69ml injection of LA with frequent aspiration. Patient tolerated procedure well.    Right Adductor Canal Block

## 2020-11-06 NOTE — Plan of Care (Signed)
  Problem: Activity: Goal: Ability to avoid complications of mobility impairment will improve Outcome: Progressing Goal: Range of joint motion will improve Outcome: Progressing   

## 2020-11-06 NOTE — Anesthesia Procedure Notes (Signed)
Spinal  Patient location during procedure: OR Start time: 11/06/2020 12:50 PM End time: 11/06/2020 12:54 PM Reason for block: surgical anesthesia Preanesthetic Checklist Completed: patient identified, IV checked, site marked, risks and benefits discussed, surgical consent, monitors and equipment checked, pre-op evaluation and timeout performed Spinal Block Patient position: sitting Prep: DuraPrep and site prepped and draped Patient monitoring: heart rate, cardiac monitor, continuous pulse ox and blood pressure Approach: midline Location: L3-4 Injection technique: single-shot Needle Needle type: Pencan  Needle gauge: 24 G Needle length: 9 cm Needle insertion depth: 7 cm Assessment Sensory level: T4 Events: CSF return Additional Notes Patient tolerated procedure well. Adequate sensory level.

## 2020-11-06 NOTE — Op Note (Signed)
Preop diagnosis: Right knee primary osteoarthritis  Postop diagnosis same  Procedure: Right total knee arthroplasty  Surgeon :Annell Greening, MD  Assistant: Zonia Kief, PA-C, medically necessary and present for the entire procedure  Second Assistant :Harlan Stains, RNFA.  Anesthesia is preoperative abductor block plus spinal anesthesia plus Exparel Marcaine.  Tracking time 300 times less than 1 hour  ImplantsImplants  CEMENT HV SMART SET - [id number]  Inventory Item: CEMENT HV SMART SET Serial no.:  Model/Cat no.: [id number]  Implant name: CEMENT HV SMART SET - [id number] Laterality: Right Area: Knee  Manufacturer: [employer] Date of Manufacture:    Action: Implanted Number Used: 1   Device Identifier:  Device Identifier Type:     CEMENT HV SMART SET - [id number]  Inventory Item: CEMENT HV SMART SET Serial no.:  Model/Cat no.: [id number]  Implant name: CEMENT HV SMART SET - [id number] Laterality: Right Area: Knee  Manufacturer: [employer] Date of Manufacture:    Action: Implanted Number Used: 1   Device Identifier:  Device Identifier Type:     ATTUNE PSRP INSR SZ5 KNEE - [id number]  Inventory Item: ATTUNE PSRP INSR SZ5 KNEE Serial no.:  Model/Cat no.: [id number]  Implant name: ATTUNE PSRP INSR SZ5 KNEE - [id number] Laterality: Right Area: Knee  Manufacturer: [employer] Date of Manufacture:    Action: Implanted Number Used: 1   Device Identifier:  Device Identifier Type:     ATTUNE MED DOME PAT KNEE - [id number]  Inventory Item: ATTUNE MED DOME PAT KNEE Serial no.:  Model/Cat no.: [id number]  Implant name: ATTUNE MED DOME PAT KNEE - [id number] Laterality: Right Area: Knee  Manufacturer: [employer] Date of Manufacture:    Action: Implanted Number Used: 1   Device Identifier:  Device Identifier Type:     ATTUNE PS FEM RT SZ 5 CEM KNEE - [id number]  Inventory Item: ATTUNE PS FEM RT SZ 5 CEM KNEE Serial no.:  Model/Cat no.:  [id number]  Implant name: ATTUNE PS FEM RT SZ 5 CEM KNEE - [id number] Laterality: Right Area: Knee  Manufacturer: [employer] Date of Manufacture:    Action: Implanted Number Used: 1   Device Identifier:  Device Identifier Type:     TIBIAL BASE ROT PLAT SZ 5 KNEE - [id number]  Inventory Item: TIBIAL BASE ROT PLAT SZ 5 KNEE Serial no.:  Model/Cat no.: [id number]  Implant name: TIBIAL BASE ROT PLAT SZ 5 KNEE - [id number] Laterality: Right Area: Knee  Manufacturer: [employer] Date of Manufacture:    Action: Implanted Number Used: 1   Device Identifier:  Device Identifier Type:      Procedure after standard induction of spinal anesthesia proximal thigh tourniquet heel bump lateral post prepped with DuraPrep usual extremity sheets and drapes were applied with sterile skin marker Betadine Steri-Drape Salena skin TXA was given IV prior to tourniquet inflation Ancef prophylaxis and leg was wrapped in Esmarch with tourniquet inflated after timeout.  Midline incision was made medial parapatellar incision patella was everted 10 mm resected patella was small it ended up being a size 32.  Osteophytes bone-on-bone eburnated areas were noted.  Intramedullary hole was drilled in the femur 10 mm resected on the femur.  10 mm resected on the tibia and 5 mm spacer block gave nearly snug fit in 6 was a better fit with full extension and balance collateral.  Chamfer cuts box cuts were made on the femur bone was hard.  Sizing on the tibia was a size  5.  Preparation pulsatile lavage back to mixing the cement.  Tibia cemented first followed by femur placement of the 6 mm rotating platform based on trials.  Patella 32 mm 3 peg was held with a clamp until cement was hardened 15 minutes.  Exparel Marcaine was injected while the cement was setting up.  Deflation of tourniquet hemostasis obtained.  Standard layered closure #1 Vicryl in the deep capsule 2 on subtenons tissue skin staple closure postop dressing knee  immobilizer.  Instrument count needle count was correct.  Patient was transferred recovery in stable condition.

## 2020-11-06 NOTE — Discharge Instructions (Signed)

## 2020-11-06 NOTE — Progress Notes (Signed)
Orthopedic Tech Progress Note Patient Details:  Mary Harvey Apr 01, 1955 [id number]  CPM Right Knee CPM Right Knee: On Right Knee Flexion (Degrees): 0 Right Knee Extension (Degrees): 60  Post Interventions Patient Tolerated: Well Instructions Provided: Care of device     Post Interventions Patient Tolerated: Well Instructions Provided: Care of device  Donald Pore 11/06/2020, 4:14 PM

## 2020-11-06 NOTE — Evaluation (Signed)
Physical Therapy Evaluation Patient Details Name: Mary Harvey MRN: [id number] DOB: [birth year] Today's Date: 11/06/2020  History of Present Illness  Pt is a 65 y.o. F s/p right total knee arthroplasty 11/06/2020. Significant PMH: HTN, L TKA, L TSA.  Clinical Impression  Pt admitted s/p R TKA. Pt agreeable to session with goal to mobilize to the chair to eat her dinner. Pt presents with right leg weakness, decreased ROM, pain, gait abnormalities. Ambulating ~5 ft with a walker at a min assist level. Pt reporting mild "wooziness," so further distance not attempted. Initiated HEP (written handout provided). Suspect good progress considering age and PLOF.      Recommendations for follow up therapy are one component of a multi-disciplinary discharge planning process, led by the attending physician.  Recommendations may be updated based on patient status, additional functional criteria and insurance authorization.  Follow Up Recommendations Follow surgeon's recommendation for DC plan and follow-up therapies    Equipment Recommendations  3in1 (PT)    Recommendations for Other Services       Precautions / Restrictions Precautions Precautions: Fall Restrictions Weight Bearing Restrictions: No      Mobility  Bed Mobility Overal bed mobility: Needs Assistance Bed Mobility: Supine to Sit     Supine to sit: Min assist     General bed mobility comments: Light minA for RLE progression off edge of bed    Transfers Overall transfer level: Needs assistance Equipment used: Rolling walker (2 wheeled) Transfers: Sit to/from Stand Sit to Stand: Min guard         General transfer comment: cues for hand placement  Ambulation/Gait Ambulation/Gait assistance: Min assist Gait Distance (Feet): 5 Feet Assistive device: Rolling walker (2 wheeled) Gait Pattern/deviations: Step-to pattern;Decreased stance time - right;Decreased dorsiflexion - right;Decreased weight shift to  right;Antalgic Gait velocity: decreased   General Gait Details: Cues for walker use, sequencing, use of arms. Pt with mild right knee buckle, requiring min assist  Stairs            Wheelchair Mobility    Modified Rankin (Stroke Patients Only)       Balance Overall balance assessment: Needs assistance Sitting-balance support: Feet supported Sitting balance-Leahy Scale: Good     Standing balance support: Bilateral upper extremity supported Standing balance-Leahy Scale: Poor Standing balance comment: reliant on BUE support                             Pertinent Vitals/Pain Pain Assessment: Faces Faces Pain Scale: Hurts even more Pain Location: R knee Pain Descriptors / Indicators: Grimacing;Operative site guarding Pain Intervention(s): Limited activity within patient's tolerance;Monitored during session    Home Living Family/patient expects to be discharged to:: Private residence Living Arrangements: Spouse/significant other Available Help at Discharge: Family Type of Home: House Home Access: Stairs to enter Entrance Stairs-Rails: Doctor, general practice of Steps: 3 Home Layout: Able to live on main level with bedroom/bathroom Home Equipment: Walker - 2 wheels;Cane - single point      Prior Function Level of Independence: Independent         Comments: makes guns     Hand Dominance        Extremity/Trunk Assessment   Upper Extremity Assessment Upper Extremity Assessment: Overall WFL for tasks assessed    Lower Extremity Assessment Lower Extremity Assessment: RLE deficits/detail RLE Deficits / Details: Able to perform SLR, quad set       Communication   Communication: No difficulties  Cognition Arousal/Alertness: Awake/alert Behavior During Therapy: WFL for tasks assessed/performed Overall Cognitive Status: Within Functional Limits for tasks assessed                                        General  Comments General comments (skin integrity, edema, etc.): Reviewed quad sets, ankle pumps    Exercises     Assessment/Plan    PT Assessment Patient needs continued PT services  PT Problem List Decreased strength;Decreased range of motion;Decreased activity tolerance;Decreased mobility;Decreased balance;Pain       PT Treatment Interventions DME instruction;Gait training;Stair training;Functional mobility training;Therapeutic activities;Therapeutic exercise;Balance training;Patient/family education    PT Goals (Current goals can be found in the Care Plan section)  Acute Rehab PT Goals Patient Stated Goal: eat PT Goal Formulation: With patient Time For Goal Achievement: 11/20/20 Potential to Achieve Goals: Good    Frequency 7X/week   Barriers to discharge        Co-evaluation               AM-PAC PT "6 Clicks" Mobility  Outcome Measure Help needed turning from your back to your side while in a flat bed without using bedrails?: None Help needed moving from lying on your back to sitting on the side of a flat bed without using bedrails?: A Little Help needed moving to and from a bed to a chair (including a wheelchair)?: A Little Help needed standing up from a chair using your arms (e.g., wheelchair or bedside chair)?: A Little Help needed to walk in hospital room?: A Little Help needed climbing 3-5 steps with a railing? : A Lot 6 Click Score: 18    End of Session Equipment Utilized During Treatment: Gait belt Activity Tolerance: Patient tolerated treatment well Patient left: in chair;with call bell/phone within reach;with family/visitor present Nurse Communication: Mobility status PT Visit Diagnosis: Unsteadiness on feet (R26.81);Other abnormalities of gait and mobility (R26.89);Difficulty in walking, not elsewhere classified (R26.2);Pain Pain - Right/Left: Right Pain - part of body: Knee    Time: 1517-6160 PT Time Calculation (min) (ACUTE ONLY): 19 min   Charges:    PT Evaluation $PT Eval Low Complexity: 1 Low          Lillia Pauls, PT, DPT Acute Rehabilitation Services Pager [phone] Office [phone]   Norval Morton 11/06/2020, 5:22 PM

## 2020-11-07 ENCOUNTER — Encounter (HOSPITAL_COMMUNITY): Payer: Self-pay | Admitting: Orthopaedic Surgery

## 2020-11-07 DIAGNOSIS — M1711 Unilateral primary osteoarthritis, right knee: Secondary | ICD-10-CM | POA: Diagnosis not present

## 2020-11-07 LAB — CBC
HCT: 40.6 % (ref 36.0–46.0)
Hemoglobin: 13.2 g/dL (ref 12.0–15.0)
MCH: 27.5 pg (ref 26.0–34.0)
MCHC: 32.5 g/dL (ref 30.0–36.0)
MCV: 84.6 fL (ref 80.0–100.0)
Platelets: 218 10*3/uL (ref 150–400)
RBC: 4.8 MIL/uL (ref 3.87–5.11)
RDW: 14.1 % (ref 11.5–15.5)
WBC: 13.9 10*3/uL — ABNORMAL HIGH (ref 4.0–10.5)
nRBC: 0 % (ref 0.0–0.2)

## 2020-11-07 LAB — GLUCOSE, CAPILLARY
Glucose-Capillary: 122 mg/dL — ABNORMAL HIGH (ref 70–99)
Glucose-Capillary: 151 mg/dL — ABNORMAL HIGH (ref 70–99)
Glucose-Capillary: 121 mg/dL — ABNORMAL HIGH (ref 70–99)
Glucose-Capillary: 94 mg/dL (ref 70–99)

## 2020-11-07 LAB — BASIC METABOLIC PANEL
Anion gap: 7 (ref 5–15)
BUN: 13 mg/dL (ref 8–23)
CO2: 25 mmol/L (ref 22–32)
Calcium: 9.1 mg/dL (ref 8.9–10.3)
Chloride: 101 mmol/L (ref 98–111)
Creatinine, Ser: 0.81 mg/dL (ref 0.44–1.00)
GFR, Estimated: >60 mL/min (ref >60)
Glucose, Bld: 125 mg/dL — ABNORMAL HIGH (ref 70–99)
Potassium: 4 mmol/L (ref 3.5–5.1)
Sodium: 133 mmol/L — ABNORMAL LOW (ref 135–145)

## 2020-11-07 NOTE — Progress Notes (Signed)
Declines to wear immobilizer while in bed

## 2020-11-07 NOTE — Progress Notes (Signed)
Patient ID: Mary Harvey, female   DOB: Aug 10, 1955, 65 y.o.   MRN: [id number]   Subjective: 1 Day Post-Op Procedure(s) (LRB): RIGHT TOTAL KNEE ARTHROPLASTY (Right) Patient reports pain as mild.    Objective: Vital signs in last 24 hours: Temp:  [97.6 F (36.4 C)-98.7 F (37.1 C)] 98.7 F (37.1 C) (09/27 0749) Pulse Rate:  [76-101] 90 (09/27 0749) Resp:  [14-18] 18 (09/27 0749) BP: (104-169)/(56-99) 126/68 (09/27 0749) SpO2:  [96 %-100 %] 96 % (09/27 0749) Weight:  [93.4 kg] 93.4 kg (09/26 1004)  Intake/Output from previous day: 09/26 0701 - 09/27 0700 In: 1834.8 [I.V.:1734.8; IV Piggyback:100] Out: 600 [Urine:550; Blood:50] Intake/Output this shift: Total I/O In: 60 [P.O.:60] Out: -   Recent Labs    11/06/20 1023 11/07/20 0131  HGB 15.0 13.2   Recent Labs    11/06/20 1023 11/07/20 0131  WBC  --  13.9*  RBC  --  4.80  HCT 44.0 40.6  PLT  --  218   Recent Labs    11/06/20 1023 11/07/20 0131  NA 139 133*  K 4.2 4.0  CL 101 101  CO2  --  25  BUN 11 13  CREATININE 0.80 0.81  GLUCOSE 107* 125*  CALCIUM  --  9.1   No results for input(s): LABPT, INR in the last 72 hours.  Neurologically intact DG Knee 1-2 Views Right  Result Date: 11/06/2020 CLINICAL DATA:  Right knee arthroplasty EXAM: RIGHT KNEE - 1-2 VIEW COMPARISON:  01/13/2019 FINDINGS: Frontal and cross-table lateral views of the right knee are obtained. 3 component right knee arthroplasty in the expected position without signs of acute complication. Postoperative changes are seen in the soft tissues. IMPRESSION: 1. Unremarkable right knee arthroplasty. Electronically Signed   By: Sharlet Salina M.D.   On: 11/06/2020 15:35    Assessment/Plan: 1 Day Post-Op Procedure(s) (LRB): RIGHT TOTAL KNEE ARTHROPLASTY (Right) Up with therapy. Has been getting OOB to bathroom. PT today , home today or tomorrow based on PT progress.   Eldred Manges 11/07/2020, 8:13 AM

## 2020-11-07 NOTE — Progress Notes (Signed)
Physical Therapy Treatment Patient Details Name: RAFEEF Harvey MRN: [id number] DOB: Mar 24, 1955 Today's Date: 11/07/2020   History of Present Illness Pt is a 65 y.o. F s/p right total knee arthroplasty 11/06/2020. Significant PMH: HTN, L TKA, L TSA.    PT Comments    Continuing work on functional mobility and activity tolerance;  R knee is progressing nicely -- improving ROM, and notably more stable in extension in stance than last session; Distance this morning limited by nausea, and pt quite slow moving -- Perhaps due to IV pain meds prior to session; Will try holding IV meds until after PT; Pt is still very motivated and requested to do therex even with the nausea;   At this point, my clinical gut is leaning toward DC tomorrow to give [place] the opportunity for more training, and more time to dial in her pain control -- plan to return today in the early afternoon  Recommendations for follow up therapy are one component of a multi-disciplinary discharge planning process, led by the attending physician.  Recommendations may be updated based on patient status, additional functional criteria and insurance authorization.  Follow Up Recommendations  Follow surgeon's recommendation for DC plan and follow-up therapies     Equipment Recommendations  3in1 (PT)    Recommendations for Other Services       Precautions / Restrictions Precautions Precautions: Fall     Mobility  Bed Mobility Overal bed mobility: Needs Assistance Bed Mobility: Supine to Sit     Supine to sit: Min guard     General bed mobility comments: No need for physical assist; HOB elevated    Transfers Overall transfer level: Needs assistance Equipment used: Rolling walker (2 wheeled) Transfers: Sit to/from Stand Sit to Stand: Min guard         General transfer comment: cues for hand placement  Ambulation/Gait Ambulation/Gait assistance: Min guard Gait Distance (Feet): 8 Feet (bed to recliner) Assistive  device: Rolling walker (2 wheeled) Gait Pattern/deviations: Step-to pattern;Decreased stance time - right;Decreased dorsiflexion - right;Decreased weight shift to right;Antalgic Gait velocity: decreased   General Gait Details: Cues for walker use, sequencing, use of arms. Pt with close guard for right knee buckle, noting slight dips into  R knee flexion in stance, but pt able to support self on RW   Stairs             Wheelchair Mobility    Modified Rankin (Stroke Patients Only)       Balance     Sitting balance-Leahy Scale: Good       Standing balance-Leahy Scale: Poor Standing balance comment: reliant on BUE support                            Cognition Arousal/Alertness: Awake/alert Behavior During Therapy: WFL for tasks assessed/performed Overall Cognitive Status: Within Functional Limits for tasks assessed                                        Exercises Total Joint Exercises Quad Sets: AROM;Right;10 reps Long Arc Quad: AAROM;Right;10 reps Knee Flexion: AAROM;Right;10 reps;Seated Goniometric ROM: approx 2-90 deg    General Comments General comments (skin integrity, edema, etc.): On room air; O2 sats 97% HR 89      Pertinent Vitals/Pain Pain Assessment: 0-10 Pain Score: 9  Pain Location: R knee with standing and walking;  no pain at rest Pain Descriptors / Indicators: Grimacing;Operative site guarding Pain Intervention(s): Monitored during session;Premedicated before session    Home Living                      Prior Function            PT Goals (current goals can now be found in the care plan section) Acute Rehab PT Goals Patient Stated Goal: Feel better so she can walk more PT Goal Formulation: With patient Time For Goal Achievement: 11/20/20 Potential to Achieve Goals: Good Progress towards PT goals: Progressing toward goals    Frequency    7X/week      PT Plan Current plan remains appropriate     Co-evaluation              AM-PAC PT "6 Clicks" Mobility   Outcome Measure  Help needed turning from your back to your side while in a flat bed without using bedrails?: None Help needed moving from lying on your back to sitting on the side of a flat bed without using bedrails?: None Help needed moving to and from a bed to a chair (including a wheelchair)?: A Little Help needed standing up from a chair using your arms (e.g., wheelchair or bedside chair)?: A Little Help needed to walk in hospital room?: A Little Help needed climbing 3-5 steps with a railing? : A Lot 6 Click Score: 19    End of Session Equipment Utilized During Treatment: Gait belt Activity Tolerance: Patient tolerated treatment well Patient left: in chair;with call bell/phone within reach Nurse Communication: Mobility status PT Visit Diagnosis: Unsteadiness on feet (R26.81);Other abnormalities of gait and mobility (R26.89);Difficulty in walking, not elsewhere classified (R26.2);Pain Pain - Right/Left: Right Pain - part of body: Knee     Time: 0902-0937 PT Time Calculation (min) (ACUTE ONLY): 35 min  Charges:  $Gait Training: 8-22 mins $Therapeutic Exercise: 8-22 mins                     Van Clines, PT  Acute Rehabilitation Services Pager [phone] Office [phone]    Mary Harvey 11/07/2020, 10:05 AM

## 2020-11-07 NOTE — TOC Initial Note (Addendum)
Transition of Care [facility]) - Initial/Assessment Note    Patient Details  Name: Mary Harvey MRN: [id number] Date of Birth: September 05, 1955  Transition of Care [facility]) CM/SW Contact:    Epifanio Lesches, RN Phone Number: 11/07/2020, 2:36 PM  Clinical Narrative:                      - s/p R TKA, 9/26  From home with spouse. States independent with ADL's PTA, no DME usage. NCM spoke with pt regarding d/c planning. Pt states agreeable to home health PT services if needed. Pt without preference. States utilized home health services yrs ago with L knee surgery but can't remember name of agency. NCM to faxed home health referral to  [facility] in [place], [phone] if needed.  Pt states would like youth walker for home...referral made with [facility], walker will be delivered to bedside prior to d/c. Pt declined 3 IN 1/BSC.  Pt without Rx med concerns/affordability. Husband to provide transportation to home once d/c ready.  TOC team following and will continue to assist with TOC needs.  11/07/2020 @ 1644 Home health PT order faxed to [facility] and accepted.   Expected Discharge Plan: Home/Self Care (vs home health)     Patient Goals and CMS Choice     Choice offered to / list presented to : Patient  Expected Discharge Plan and Services Expected Discharge Plan: Home/Self Care (vs home health)   Discharge Planning Services: CM Consult                     DME Arranged: 3-N-1 ( pt declined)   Date DME Agency Contacted: 11/07/20 Time DME Agency Contacted: 1328 Representative spoke with at DME Agency: Velna Hatchet            Prior Living Arrangements/Services   Lives with:: Spouse Patient language and need for interpreter reviewed:: Yes        Need for Family Participation in Patient Care: Yes (Comment) Care giver support system in place?: Yes (comment)   Criminal Activity/Legal Involvement Pertinent to Current Situation/Hospitalization: No - Comment as  needed  Activities of Daily Living Home Assistive Devices/Equipment: Contact lenses, Eyeglasses, Dentures (specify type), CPAP, Scales, Blood pressure cuff, CBG Meter ADL Screening (condition at time of admission) Patient's cognitive ability adequate to safely complete daily activities?: Yes Is the patient deaf or have difficulty hearing?: No Does the patient have difficulty seeing, even when wearing glasses/contacts?: No Does the patient have difficulty concentrating, remembering, or making decisions?: No Patient able to express need for assistance with ADLs?: Yes Does the patient have difficulty dressing or bathing?: No Independently performs ADLs?: Yes (appropriate for developmental age) Does the patient have difficulty walking or climbing stairs?: Yes Weakness of Legs: Right Weakness of Arms/Hands: None  Permission Sought/Granted                  Emotional Assessment Appearance:: Appears stated age Attitude/Demeanor/Rapport: Engaged Affect (typically observed): Accepting Orientation: : Oriented to Self, Oriented to Place, Oriented to  Time, Oriented to Situation Alcohol / Substance Use: Not Applicable Psych Involvement: No (comment)  Admission diagnosis:  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 [place] [address] Phone: [phone] Fax: [phone]  [facility] [place] - [address] [place] [address] Phone: [phone] Fax: [phone]  [facility] [address]) - [place], [place] - [address] [place] [address] Phone: [phone] Fax: [phone]  [facility] - [place], [place] - [address] [place] [address] Phone: [phone] Fax: [phone]     Social Determinants of Health (SDOH) Interventions    Readmission Risk Interventions No flowsheet data found.

## 2020-11-07 NOTE — Addendum Note (Signed)
Addendum  created 11/07/20 1252 by Mal Amabile, MD   Clinical Note Signed, Intraprocedure Blocks edited, SmartForm saved

## 2020-11-07 NOTE — Progress Notes (Signed)
Patient ID: Mary Harvey, female   DOB: [birth year], 65 y.o.   MRN: [id number] Patient needs an additional day of therapy for safe ambulation she will stay tonight.  Where she [place] in [place] a difficult area for home health coverage and several additional home health agencies will have to be contacted to find therapy that can come out to her house for few weeks.  If no home health is available then she will have to good outpatient therapy which will be difficult in the immediate postop time period.  Continue PT tomorrow and likely discharge Wednesday afternoon.

## 2020-11-07 NOTE — Progress Notes (Signed)
Physical Therapy Treatment Patient Details Name: Mary Harvey MRN: [id number] DOB: 05-02-1955 Today's Date: 11/07/2020   History of Present Illness Pt is a 65 y.o. F s/p right total knee arthroplasty 11/06/2020. Significant PMH: HTN, L TKA, L TSA.    PT Comments    Continuing work on functional mobility and activity tolerance; Session focused on progressive amb with emphasis on R knee stability in stance; Very good progress -- but with noted dizziness and reports of what sounds like pre-syncopal symptoms; Will consider getting Orthostatic BPs next session, and reported to RN, who may get a set of orthostatic BPs later today as well;   Pt's husband present during session, and very helpful; On track for dc home tomorrow -- recommend 2 PT sessions   Recommendations for follow up therapy are one component of a multi-disciplinary discharge planning process, led by the attending physician.  Recommendations may be updated based on patient status, additional functional criteria and insurance authorization.  Follow Up Recommendations  Follow surgeon's recommendation for DC plan and follow-up therapies     Equipment Recommendations  Rolling walker with 5" wheels;Other (comment);3in1 (PT) (Youth-sized RW)    Recommendations for Other Services       Precautions / Restrictions Precautions Precautions: Fall Precaution Comments: Reported dizziness with incr time in upright standing and walking (REcommend orthostatics next session) Restrictions Weight Bearing Restrictions: No     Mobility  Bed Mobility Overal bed mobility: Needs Assistance Bed Mobility: Supine to Sit;Sit to Supine     Supine to sit: Min guard Sit to supine: Min assist   General bed mobility comments: Min assist (provided by husband) to help LEs into bed    Transfers Overall transfer level: Needs assistance Equipment used: Rolling walker (2 wheeled) Transfers: Sit to/from Stand Sit to Stand: Min guard          General transfer comment: cues for hand placement; stood from bed and commode  Ambulation/Gait Ambulation/Gait assistance: Min guard Gait Distance (Feet): 85 Feet Assistive device: Rolling walker (2 wheeled) Gait Pattern/deviations: Step-through pattern (emerging) Gait velocity: decreased   General Gait Details: Cues for walker use, sequencing, use of arms. Pt with close guard for right knee buckle, noting slight dips into  R knee flexion in stance, but pt able to support self on RW; noted quite fatigued post amb, and pt reported dizziness sitting EOB after amb; BP 112/61, was possibly lower with increased time in upright standing   Stairs             Wheelchair Mobility    Modified Rankin (Stroke Patients Only)       Balance     Sitting balance-Leahy Scale: Good       Standing balance-Leahy Scale: Poor Standing balance comment: reliant on BUE support                            Cognition Arousal/Alertness: Awake/alert Behavior During Therapy: WFL for tasks assessed/performed;Flat affect Overall Cognitive Status: Within Functional Limits for tasks assessed                                        Exercises      General Comments General comments (skin integrity, edema, etc.): very fatigued at end of walk, and pt requested return to bed; Reported pre-syncopal symptoms, and we opted to take her BP sitting up  EOB: 112/61 (MAP 73), HR 88      Pertinent Vitals/Pain Pain Assessment: 0-10 Pain Score: 8  Pain Location: R knee with standing and walking; no pain at rest Pain Descriptors / Indicators: Grimacing;Operative site guarding Pain Intervention(s): Monitored during session    Home Living                      Prior Function            PT Goals (current goals can now be found in the care plan section) Acute Rehab PT Goals Patient Stated Goal: Feel better so she can walk more PT Goal Formulation: With patient Time For  Goal Achievement: 11/20/20 Potential to Achieve Goals: Good Progress towards PT goals: Progressing toward goals    Frequency    7X/week      PT Plan Current plan remains appropriate    Co-evaluation              AM-PAC PT "6 Clicks" Mobility   Outcome Measure  Help needed turning from your back to your side while in a flat bed without using bedrails?: None Help needed moving from lying on your back to sitting on the side of a flat bed without using bedrails?: None Help needed moving to and from a bed to a chair (including a wheelchair)?: A Little Help needed standing up from a chair using your arms (e.g., wheelchair or bedside chair)?: A Little Help needed to walk in hospital room?: A Little Help needed climbing 3-5 steps with a railing? : A Lot 6 Click Score: 19    End of Session Equipment Utilized During Treatment: Gait belt Activity Tolerance: Patient tolerated treatment well Patient left: in bed;with call bell/phone within reach Nurse Communication: Mobility status;Other (comment) (Consider getting a set of Orthostatic vitals) PT Visit Diagnosis: Unsteadiness on feet (R26.81);Other abnormalities of gait and mobility (R26.89);Difficulty in walking, not elsewhere classified (R26.2);Pain Pain - Right/Left: Right Pain - part of body: Knee     Time: 1610-9604 PT Time Calculation (min) (ACUTE ONLY): 28 min  Charges:  $Gait Training: 8-22 mins $Therapeutic Activity: 8-22 mins                     Van Clines, PT  Acute Rehabilitation Services Pager [phone] Office [phone]    Mary Harvey 11/07/2020, 4:48 PM

## 2020-11-08 DIAGNOSIS — M1711 Unilateral primary osteoarthritis, right knee: Secondary | ICD-10-CM | POA: Diagnosis not present

## 2020-11-08 LAB — CBC
HCT: 34.8 % — ABNORMAL LOW (ref 36.0–46.0)
Hemoglobin: 11.6 g/dL — ABNORMAL LOW (ref 12.0–15.0)
MCH: 28 pg (ref 26.0–34.0)
MCHC: 33.3 g/dL (ref 30.0–36.0)
MCV: 83.9 fL (ref 80.0–100.0)
Platelets: 208 10*3/uL (ref 150–400)
RBC: 4.15 MIL/uL (ref 3.87–5.11)
RDW: 14.3 % (ref 11.5–15.5)
WBC: 10.8 10*3/uL — ABNORMAL HIGH (ref 4.0–10.5)
nRBC: 0 % (ref 0.0–0.2)

## 2020-11-08 LAB — GLUCOSE, CAPILLARY: Glucose-Capillary: 96 mg/dL (ref 70–99)

## 2020-11-08 MED ORDER — METHOCARBAMOL 500 MG PO TABS: 500.0000 mg | ORAL_TABLET | Freq: Four times a day (QID) | ORAL | 0 refills | Status: AC | PRN

## 2020-11-08 MED ORDER — OXYCODONE-ACETAMINOPHEN 7.5-325 MG PO TABS: 1.0000 | ORAL_TABLET | ORAL | 0 refills | Status: AC | PRN

## 2020-11-08 MED ORDER — ASPIRIN EC 325 MG PO TBEC: 325.0000 mg | DELAYED_RELEASE_TABLET | Freq: Every day | ORAL | 0 refills | Status: AC

## 2020-11-08 NOTE — Progress Notes (Signed)
Patient ID: Mary Harvey, female   DOB: 1955/06/12, 65 y.o.   MRN: [id number]   Subjective: 2 Days Post-Op Procedure(s) (LRB): RIGHT TOTAL KNEE ARTHROPLASTY (Right) Patient reports pain as mild and moderate.    Objective: Vital signs in last 24 hours: Temp:  [98.1 F (36.7 C)-98.9 F (37.2 C)] 98.9 F (37.2 C) (09/27 2036) Pulse Rate:  [88-102] 102 (09/27 2036) Resp:  [18] 18 (09/27 2036) BP: (109-126)/(61-69) 110/69 (09/27 2036) SpO2:  [96 %-99 %] 99 % (09/27 2036)  Intake/Output from previous day: 09/27 0701 - 09/28 0700 In: 480 [P.O.:480] Out: -  Intake/Output this shift: No intake/output data recorded.  Recent Labs    11/06/20 1023 11/07/20 0131 11/08/20 0342  HGB 15.0 13.2 11.6*   Recent Labs    11/07/20 0131 11/08/20 0342  WBC 13.9* 10.8*  RBC 4.80 4.15  HCT 40.6 34.8*  PLT 218 208   Recent Labs    11/06/20 1023 11/07/20 0131  NA 139 133*  K 4.2 4.0  CL 101 101  CO2  --  25  BUN 11 13  CREATININE 0.80 0.81  GLUCOSE 107* 125*  CALCIUM  --  9.1   No results for input(s): LABPT, INR in the last 72 hours.  Neurologically intact No results found.  Assessment/Plan: 2 Days Post-Op Procedure(s) (LRB): RIGHT TOTAL KNEE ARTHROPLASTY (Right) Discharge home with home health PT. ROV one week  Eldred Manges 11/08/2020, 7:31 AM

## 2020-11-08 NOTE — TOC Transition Note (Signed)
Transition of Care [facility] - CM/SW Discharge Note   Patient Details  Name: MORRISSA SHEIN MRN: [id number] Date of Birth: [birth year]  [facility] CM/SW Contact:  Epifanio Lesches, RN Phone Number: 11/08/2020, 10:17 AM   Clinical Narrative:    Patient will DC to: home Anticipated DC date: 11/08/2020 Family notified: yes Transport by: car             - s/p R TKA, 9/26 Per MD patient ready for DC today . RN, patient, and patient's husband notified of DC.  Post hospital f/u noted on AVS.  DME : youth walker @ bedside , delivered by [employer].  Pt without Rx med concerns.  RNCM will sign off for now as intervention is no longer needed. Please consult [place] again if new needs arise.    Final next level of care: Home w Home Health Services Barriers to Discharge: No Barriers Identified   Patient Goals and CMS Choice     Choice offered to / list presented to : Patient  Discharge Placement                       Discharge Plan and Services   Discharge Planning Services: CM Consult            DME Arranged: 3-N-1   Date DME Agency Contacted: 11/07/20 Time DME Agency Contacted: 1328 Representative spoke with at DME Agency: Velna Hatchet HH Arranged: PT HH Agency: [facility] Date HH Agency Contacted: 11/07/20 Time HH Agency Contacted: 1645 Representative spoke with at [facility] Agency: Amy  Social Determinants of Health (SDOH) Interventions     Readmission Risk Interventions No flowsheet data found.

## 2020-11-08 NOTE — Progress Notes (Signed)
Physical Therapy Treatment Patient Details Name: Mary Harvey MRN: [id number] DOB: [birth year] Today's Date: 11/08/2020   History of Present Illness Pt is a 65 y.o. F s/p right total knee arthroplasty 11/06/2020. Significant PMH: HTN, L TKA, L TSA.    PT Comments    Patient received up in recliner, very pain limited this morning but cooperative. R knee extension AROM 0 degrees, flexion AAROM 46 degrees and limited by pain. Deferred gait training this morning- she is requesting to leave this AM/having high amounts of pain and still needed to practice steps, so we focused on that instead of ruling out orthostatics. Orthostatics were positive with initial stand as below, but able to recover with extended standing. Did well with stair training. Accepted TKR HEP but politely declines practice and she is familiar from her prior TKR procedure. Left up in recliner with all needs met, spouse present. Will plan to return this afternoon if she is still in house.    11/08/20 1018  Vital Signs  Patient Position (if appropriate) Orthostatic Vitals  Orthostatic Lying   BP- Lying  (up in recliner already)  Orthostatic Sitting  BP- Sitting 118/64  Orthostatic Standing at 0 minutes  BP- Standing at 0 minutes 97/83  Orthostatic Standing at 3 minutes  BP- Standing at 3 minutes 121/79      Recommendations for follow up therapy are one component of a multi-disciplinary discharge planning process, led by the attending physician.  Recommendations may be updated based on patient status, additional functional criteria and insurance authorization.  Follow Up Recommendations  Follow surgeon's recommendation for DC plan and follow-up therapies     Equipment Recommendations  Rolling walker with 5" wheels;Other (comment);3in1 (PT) (youth RW)    Recommendations for Other Services       Precautions / Restrictions Precautions Precautions: Fall Precaution Comments: Reported dizziness with incr time in  upright standing and walking Restrictions Weight Bearing Restrictions: Yes RLE Weight Bearing: Weight bearing as tolerated     Mobility  Bed Mobility               General bed mobility comments: up in recliner upon entry    Transfers Overall transfer level: Needs assistance Equipment used: Rolling walker (2 wheeled) Transfers: Sit to/from Stand Sit to Stand: Supervision         General transfer comment: cues for hand placement, significant amount of increased time  Ambulation/Gait             General Gait Details: deferred- pain and need to focus on steps prior to possible morning DC   Stairs Stairs: Yes Stairs assistance: Min guard Stair Management: Two rails;Forwards Number of Stairs: 2 General stair comments: step to pattern, Mod cues for sequencing but stable with B rails   Wheelchair Mobility    Modified Rankin (Stroke Patients Only)       Balance Overall balance assessment: Needs assistance Sitting-balance support: Feet supported Sitting balance-Leahy Scale: Good     Standing balance support: Bilateral upper extremity supported Standing balance-Leahy Scale: Poor Standing balance comment: reliant on BUE support                            Cognition Arousal/Alertness: Awake/alert Behavior During Therapy: WFL for tasks assessed/performed;Flat affect Overall Cognitive Status: Within Functional Limits for tasks assessed  Exercises Total Joint Exercises Goniometric ROM: R knee extension AROM 0 degrees, flexion AAROM 46 degrees pain limited    General Comments        Pertinent Vitals/Pain Pain Assessment: 0-10 Pain Score: 8  Pain Location: R knee Pain Descriptors / Indicators: Grimacing;Operative site guarding;Aching Pain Intervention(s): Limited activity within patient's tolerance;Monitored during session;Patient requesting pain meds-RN notified;RN gave pain meds during  session    Home Living                      Prior Function            PT Goals (current goals can now be found in the care plan section) Acute Rehab PT Goals Patient Stated Goal: Feel better so she can walk more PT Goal Formulation: With patient Time For Goal Achievement: 11/20/20 Potential to Achieve Goals: Good Progress towards PT goals: Progressing toward goals    Frequency    7X/week      PT Plan Current plan remains appropriate    Co-evaluation              AM-PAC PT "6 Clicks" Mobility   Outcome Measure  Help needed turning from your back to your side while in a flat bed without using bedrails?: None Help needed moving from lying on your back to sitting on the side of a flat bed without using bedrails?: None Help needed moving to and from a bed to a chair (including a wheelchair)?: A Little Help needed standing up from a chair using your arms (e.g., wheelchair or bedside chair)?: A Little Help needed to walk in hospital room?: A Little Help needed climbing 3-5 steps with a railing? : A Little 6 Click Score: 20    End of Session Equipment Utilized During Treatment: Gait belt Activity Tolerance: Patient tolerated treatment well Patient left: in chair;with call bell/phone within reach;with family/visitor present Nurse Communication: Mobility status PT Visit Diagnosis: Unsteadiness on feet (R26.81);Other abnormalities of gait and mobility (R26.89);Difficulty in walking, not elsewhere classified (R26.2);Pain Pain - Right/Left: Right Pain - part of body: Knee     Time: 1015-1040 PT Time Calculation (min) (ACUTE ONLY): 25 min  Charges:  $Gait Training: 8-22 mins $Therapeutic Activity: 8-22 mins                    Windell Norfolk, DPT, PN2   Supplemental Physical Therapist [place]    Pager [phone] Acute Rehab Office [phone]

## 2020-11-08 NOTE — Progress Notes (Addendum)
Discharge summary packet provided to pt with instructions. Pt verbalized understanding of instructions with no complaints voiced. D/C to home with [facility] as ordered Pt remains alert/oriented in no apparent distress.. Pt 's spouse is responsible for pt's ride.

## 2020-11-09 LAB — GLUCOSE, CAPILLARY: Glucose-Capillary: 147 mg/dL — ABNORMAL HIGH (ref 70–99)

## 2020-11-14 ENCOUNTER — Encounter: Payer: BC Managed Care – PPO | Admitting: Orthopaedic Surgery

## 2020-11-15 NOTE — Discharge Summary (Signed)
Patient ID: Mary Harvey MRN: [id number] DOB/AGE: [birth year] 65 y.o.  Admit date: 11/06/2020 Discharge date: 11/08/2020  Admission Diagnoses:  Active Problems:   Right knee DJD   Discharge Diagnoses:  Active Problems:   Right knee DJD  status post Procedure(s): RIGHT TOTAL KNEE ARTHROPLASTY  Past Medical History:  Diagnosis Date   Arthritis    Asthma    Chronic pain    Contact lens/glasses fitting    wears contacts or glasses   GERD (gastroesophageal reflux disease)    Hypertension    Hypothyroidism    Insomnia    Mini stroke (HCC)    ~2010   Seasonal allergies    Sleep apnea    CPAP @ night   Wears partial dentures    top and bottom partials    Surgeries: Procedure(s): RIGHT TOTAL KNEE ARTHROPLASTY on 11/06/2020   Consultants:   Discharged Condition: Improved  Hospital Course: Mary Harvey is an 65 y.o. female who was admitted 11/06/2020 for operative treatment of right knee djd. Patient failed conservative treatments (please see the history and physical for the specifics) and had severe unremitting pain that affects sleep, daily activities and work/hobbies. After pre-op clearance, the patient was taken to the operating room on 11/06/2020 and underwent  Procedure(s): RIGHT TOTAL KNEE ARTHROPLASTY.    Patient was given perioperative antibiotics:  Anti-infectives (From admission, onward)    Start     Dose/Rate Route Frequency Ordered Stop   11/06/20 1000  ceFAZolin (ANCEF) IVPB 2g/100 mL premix        2 g 200 mL/hr over 30 Minutes Intravenous On call to O.R. 11/06/20 0955 11/06/20 1305        Patient was given sequential compression devices and early ambulation to prevent DVT.   Patient benefited maximally from hospital stay and there were no complications. At the time of discharge, the patient was urinating/moving their bowels without difficulty, tolerating a regular diet, pain is controlled with oral pain medications and they have been cleared by  PT/OT.   Recent vital signs: No data found.   Recent laboratory studies: No results for input(s): WBC, HGB, HCT, PLT, NA, K, CL, CO2, BUN, CREATININE, GLUCOSE, INR, CALCIUM in the last 72 hours.  Invalid input(s): PT, 2   Discharge Medications:   Allergies as of 11/08/2020       Reactions   Mobic [meloxicam] Shortness Of Breath   Metformin Hcl    Constipation         Medication List     STOP taking these medications    aspirin-sod bicarb-citric acid 325 MG Tbef tablet Commonly known as: ALKA-SELTZER   diclofenac Sodium 1 % Gel Commonly known as: VOLTAREN   ibuprofen 200 MG tablet Commonly known as: ADVIL   oxyCODONE-acetaminophen 10-325 MG tablet Commonly known as: PERCOCET Replaced by: oxyCODONE-acetaminophen 7.5-325 MG tablet       TAKE these medications    albuterol 108 (90 Base) MCG/ACT inhaler Commonly known as: VENTOLIN HFA Inhale 2 puffs into the lungs every 6 (six) hours as needed for wheezing or shortness of breath.   aspirin EC 325 MG tablet Take 1 tablet (325 mg total) by mouth daily. Must take at least 4 weeks postop DVT prophylaxis   CALCIUM PO Take 1 tablet by mouth daily.   cetirizine 10 MG tablet Commonly known as: ZYRTEC Take 10 mg by mouth daily as needed for allergies.   Dulaglutide 0.75 MG/0.5ML Sopn Inject 0.75 mg into the skin once a week.  gabapentin 100 MG capsule Commonly known as: NEURONTIN Take 100 mg by mouth at bedtime.   losartan-hydrochlorothiazide 100-25 MG tablet Commonly known as: HYZAAR Take 1 tablet by mouth daily.   MAGNESIUM PO Take 2 tablets by mouth daily.   methocarbamol 500 MG tablet Commonly known as: Robaxin Take 1 tablet (500 mg total) by mouth every 6 (six) hours as needed for muscle spasms.   multivitamin with minerals tablet Take 2 tablets by mouth daily.   Hair Skin and Nails Formula Tabs Take 1 tablet by mouth daily.   OSTEO BI-FLEX ONE PER DAY PO Take 1 tablet by mouth daily.    oxyCODONE-acetaminophen 7.5-325 MG tablet Commonly known as: Percocet Take 1-2 tablets by mouth every 4 (four) hours as needed for severe pain. Replaces: oxyCODONE-acetaminophen 10-325 MG tablet   potassium chloride SA 20 MEQ tablet Commonly known as: KLOR-CON Take 1 tablet (20 mEq total) by mouth 2 (two) times daily.   Xiidra 5 % Soln Generic drug: Lifitegrast Place 1 drop into both eyes daily as needed (dry eyes).        Diagnostic Studies: DG Knee 1-2 Views Right  Result Date: 11/06/2020 CLINICAL DATA:  Right knee arthroplasty EXAM: RIGHT KNEE - 1-2 VIEW COMPARISON:  01/13/2019 FINDINGS: Frontal and cross-table lateral views of the right knee are obtained. 3 component right knee arthroplasty in the expected position without signs of acute complication. Postoperative changes are seen in the soft tissues. IMPRESSION: 1. Unremarkable right knee arthroplasty. Electronically Signed   By: Sharlet Salina M.D.   On: 11/06/2020 15:35       Follow-up Information     Mary Manges, MD. Schedule an appointment as soon as possible for a visit today.   Specialty: Orthopedic Surgery Why: need return office visit 1  weeks postop in [place] information: [address] [place] [address] [phone]         [facility] [place] Follow up.   Why: Home health PT services will be provided by [facility], start of care within 48 hours post discharge. Office will call and arrange visit Contact information: [address]  [place], [place] [address] Phone: [phone]                Discharge Plan:  discharge to home  Disposition:     Signed: Zonia Kief  11/15/2020, 3:35 PM

## 2020-11-16 ENCOUNTER — Ambulatory Visit: Payer: Self-pay

## 2020-11-16 ENCOUNTER — Encounter: Payer: Self-pay | Admitting: Orthopaedic Surgery

## 2020-11-16 ENCOUNTER — Other Ambulatory Visit: Payer: Self-pay

## 2020-11-16 ENCOUNTER — Ambulatory Visit (INDEPENDENT_AMBULATORY_CARE_PROVIDER_SITE_OTHER): Payer: BC Managed Care – PPO | Admitting: Orthopaedic Surgery

## 2020-11-16 VITALS — Ht 64.0 in | Wt 206.0 lb

## 2020-11-16 DIAGNOSIS — Z96651 Presence of right artificial knee joint: Secondary | ICD-10-CM

## 2020-11-16 NOTE — Progress Notes (Signed)
Post-Op Visit Note   Patient: Mary Harvey           Date of Birth: Sep 13, 1955           MRN: [id number] Visit Date: 11/16/2020 PCP: Pcp, No   Assessment & Plan: Patient is   Post right total knee arthroplasty.  Staples harvested today Steri-Strips applied.  We will set up for outpatient therapy.  She states her right back stent gave her headache and she can stop the Robaxin.  She is on oxycodone acetaminophen 10/325 100 tablets monthly.  She has ongoing problems with multiple disc protrusions in her neck and also some foraminal stenosis L5-S1 single level.  Recheck 4 weeks.  Chief Complaint:  Chief Complaint  Patient presents with   Right Knee - Routine Post Op    11/06/2020 Right TKA   Visit Diagnoses:  1. Status post total right knee replacement     Plan: Return visit 4 weeks.  Follow-Up Instructions: No follow-ups on file.   Orders:  Orders Placed This Encounter  Procedures   XR Knee 1-2 Views Right   No orders of the defined types were placed in this encounter.   Imaging: XR Knee 1-2 Views Right  Result Date: 11/16/2020 Standing AP both knees lateral right knee demonstrates right total knee arthroplasty good position alignment no subsidence or loosening.  Staples are noted. Impression: Previous left total knee arthroplasty satisfactory position.  New right total knee arthroplasty no postoperative complications.   PMFS History: Patient Active Problem List   Diagnosis Date Noted   Right knee DJD 11/06/2020   Other spondylosis with radiculopathy, cervical region 01/13/2019   Chronic neck pain 12/22/2018   History of total knee arthroplasty, left 07/21/2018   Unilateral primary osteoarthritis, right knee 07/21/2018   Past Medical History:  Diagnosis Date   Arthritis    Asthma    Chronic pain    Contact lens/glasses fitting    wears contacts or glasses   GERD (gastroesophageal reflux disease)    Hypertension    Hypothyroidism    Insomnia    Mini stroke     ~2010   Seasonal allergies    Sleep apnea    CPAP @ night   Wears partial dentures    top and bottom partials    No family history on file.  Past Surgical History:  Procedure Laterality Date   ABDOMINAL HYSTERECTOMY     CHOLECYSTECTOMY     KNEE ARTHROSCOPY Left 04/09/2007   KNEE ARTHROSCOPY WITH MEDIAL MENISECTOMY Right 07/30/2012   Procedure: KNEE ARTHROSCOPY WITH MEDIAL AND LATERAL MENISECTOMIES, CHONDROPLASTY OF PATELLA;  Surgeon: Loreta Ave, MD;  Location: [facility];  Service: Orthopedics;  Laterality: Right;   SHOULDER ARTHROSCOPY  09/12/2011   right   TONSILLECTOMY     TOTAL KNEE ARTHROPLASTY Left 05/11/2008   TOTAL KNEE ARTHROPLASTY Right 11/06/2020   Procedure: RIGHT TOTAL KNEE ARTHROPLASTY;  Surgeon: Eldred Manges, MD;  Location: [facility];  Service: Orthopedics;  Laterality: Right;   TOTAL SHOULDER ARTHROPLASTY  07/25/2011   Procedure: TOTAL SHOULDER ARTHROPLASTY;  Surgeon: Loreta Ave, MD;  Location: [facility];  Service: Orthopedics;  Laterality: Left;   Social History   Occupational History   Not on file  Tobacco Use   Smoking status: Never   Smokeless tobacco: Never  Vaping Use   Vaping Use: Never used  Substance and Sexual Activity   Alcohol use: Yes    Comment: occasionally  Drug use: No   Sexual activity: Not on file

## 2020-11-20 ENCOUNTER — Other Ambulatory Visit: Payer: Self-pay | Admitting: Orthopaedic Surgery

## 2020-12-14 ENCOUNTER — Ambulatory Visit (INDEPENDENT_AMBULATORY_CARE_PROVIDER_SITE_OTHER): Payer: BC Managed Care – PPO | Admitting: Orthopaedic Surgery

## 2020-12-14 ENCOUNTER — Other Ambulatory Visit: Payer: Self-pay

## 2020-12-14 ENCOUNTER — Encounter: Payer: Self-pay | Admitting: Orthopaedic Surgery

## 2020-12-14 DIAGNOSIS — Z96651 Presence of right artificial knee joint: Secondary | ICD-10-CM | POA: Insufficient documentation

## 2020-12-14 NOTE — Progress Notes (Signed)
   Post-Op Visit Note   Patient: Mary Harvey           Date of Birth: 02/11/1956           MRN: [id number] Visit Date: 12/14/2020 PCP: Pcp, No   Assessment & Plan: Post right total knee arthroplasty.  Full extension excellent flexion.  Still has some quad weakness.  Opposite left total knee arthroplasty has good quad strength.  She needs to continue therapy working on quad strengthening recheck 5 weeks.  FMLA was covering her until 12/26.  She works at [employer].  Chief Complaint:  Chief Complaint  Patient presents with   Right Knee - Follow-up    11/06/2020 Right TKA   Visit Diagnoses:  1. History of total knee arthroplasty, right     Plan: Continue quad strengthening.  Flexion extension is good.  Recheck 5 weeks.  Follow-Up Instructions: Return in about 5 weeks (around 01/18/2021).   Orders:  No orders of the defined types were placed in this encounter.  No orders of the defined types were placed in this encounter.   Imaging: No results found.  PMFS History: Patient Active Problem List   Diagnosis Date Noted   History of total knee arthroplasty, right 12/14/2020   Right knee DJD 11/06/2020   Other spondylosis with radiculopathy, cervical region 01/13/2019   Chronic neck pain 12/22/2018   History of total knee arthroplasty, left 07/21/2018   Past Medical History:  Diagnosis Date   Arthritis    Asthma    Chronic pain    Contact lens/glasses fitting    wears contacts or glasses   GERD (gastroesophageal reflux disease)    Hypertension    Hypothyroidism    Insomnia    Mini stroke    ~2010   Seasonal allergies    Sleep apnea    CPAP @ night   Wears partial dentures    top and bottom partials    No family history on file.  Past Surgical History:  Procedure Laterality Date   ABDOMINAL HYSTERECTOMY     CHOLECYSTECTOMY     KNEE ARTHROSCOPY Left 04/09/2007   KNEE ARTHROSCOPY WITH MEDIAL MENISECTOMY Right 07/30/2012   Procedure: KNEE ARTHROSCOPY WITH  MEDIAL AND LATERAL MENISECTOMIES, CHONDROPLASTY OF PATELLA;  Surgeon: Loreta Ave, MD;  Location: [facility];  Service: Orthopedics;  Laterality: Right;   SHOULDER ARTHROSCOPY  09/12/2011   right   TONSILLECTOMY     TOTAL KNEE ARTHROPLASTY Left 05/11/2008   TOTAL KNEE ARTHROPLASTY Right 11/06/2020   Procedure: RIGHT TOTAL KNEE ARTHROPLASTY;  Surgeon: Eldred Manges, MD;  Location: [facility];  Service: Orthopedics;  Laterality: Right;   TOTAL SHOULDER ARTHROPLASTY  07/25/2011   Procedure: TOTAL SHOULDER ARTHROPLASTY;  Surgeon: Loreta Ave, MD;  Location: [facility];  Service: Orthopedics;  Laterality: Left;   Social History   Occupational History   Not on file  Tobacco Use   Smoking status: Never   Smokeless tobacco: Never  Vaping Use   Vaping Use: Never used  Substance and Sexual Activity   Alcohol use: Yes    Comment: occasionally   Drug use: No   Sexual activity: Not on file

## 2021-01-01 ENCOUNTER — Telehealth: Payer: Self-pay | Admitting: Physical Medicine and Rehabilitation

## 2021-01-01 NOTE — Telephone Encounter (Signed)
Patient called. She would like an appointment with Dr. Alvester Morin. Her call back number is [phone]

## 2021-01-16 ENCOUNTER — Telehealth: Payer: Self-pay | Admitting: Physical Medicine and Rehabilitation

## 2021-01-16 NOTE — Telephone Encounter (Signed)
IC s/w patient and advised. She verbalized understanding.  

## 2021-01-16 NOTE — Telephone Encounter (Signed)
Pt called stating someone was supposed to be reaching out to her to let her know if her insurance approved an inj. Pt states she hasn't heard anything and would like a CB to be updated.   [phone]

## 2021-01-18 ENCOUNTER — Ambulatory Visit (INDEPENDENT_AMBULATORY_CARE_PROVIDER_SITE_OTHER): Payer: BC Managed Care – PPO | Admitting: Orthopaedic Surgery

## 2021-01-18 ENCOUNTER — Other Ambulatory Visit: Payer: Self-pay

## 2021-01-18 ENCOUNTER — Encounter: Payer: Self-pay | Admitting: Orthopaedic Surgery

## 2021-01-18 VITALS — Ht 64.0 in | Wt 206.0 lb

## 2021-01-18 DIAGNOSIS — Z96651 Presence of right artificial knee joint: Secondary | ICD-10-CM

## 2021-01-18 NOTE — Progress Notes (Signed)
Post-Op Visit Note   Patient: Mary Harvey           Date of Birth: Jul 16, 1955           MRN: [id number] Visit Date: 01/18/2021 PCP: Pcp, No   Assessment & Plan: Post right total knee arthroplasty.  She had her left done years earlier.  She has full extension walking without a limp.  She works at [employer] and work slip given for work resumption 02/19/2021.  She still has problems with disc bulging at C4-5 C5-6 and C6-7 without severe compression.  She has had epidurals in the past and is thinking about calling Dr. Otelia Sergeant set 1 up again.  We had discussed cervical surgery if her symptoms persist.  Hopefully with improvement in her quad strength she will have to use her arms as much to push-up which is aggravating her neck as well as her right rotator cuff repair.  She can follow-up with me as needed.  Chief Complaint:  Chief Complaint  Patient presents with   Right Knee - Pain    11/06/2020 right TKA   Visit Diagnoses:  1. History of total knee arthroplasty, right     Plan: Work resumption after the holidays.  She can follow-up with me about her neck if she has increased problems.  Follow-Up Instructions: No follow-ups on file.   Orders:  No orders of the defined types were placed in this encounter.  No orders of the defined types were placed in this encounter.   Imaging: No results found.  PMFS History: Patient Active Problem List   Diagnosis Date Noted   History of total knee arthroplasty, right 12/14/2020   Right knee DJD 11/06/2020   Other spondylosis with radiculopathy, cervical region 01/13/2019   Chronic neck pain 12/22/2018   History of total knee arthroplasty, left 07/21/2018   Past Medical History:  Diagnosis Date   Arthritis    Asthma    Chronic pain    Contact lens/glasses fitting    wears contacts or glasses   GERD (gastroesophageal reflux disease)    Hypertension    Hypothyroidism    Insomnia    Mini stroke    ~2010   Seasonal allergies     Sleep apnea    CPAP @ night   Wears partial dentures    top and bottom partials    No family history on file.  Past Surgical History:  Procedure Laterality Date   ABDOMINAL HYSTERECTOMY     CHOLECYSTECTOMY     KNEE ARTHROSCOPY Left 04/09/2007   KNEE ARTHROSCOPY WITH MEDIAL MENISECTOMY Right 07/30/2012   Procedure: KNEE ARTHROSCOPY WITH MEDIAL AND LATERAL MENISECTOMIES, CHONDROPLASTY OF PATELLA;  Surgeon: Loreta Ave, MD;  Location: [facility];  Service: Orthopedics;  Laterality: Right;   SHOULDER ARTHROSCOPY  09/12/2011   right   TONSILLECTOMY     TOTAL KNEE ARTHROPLASTY Left 05/11/2008   TOTAL KNEE ARTHROPLASTY Right 11/06/2020   Procedure: RIGHT TOTAL KNEE ARTHROPLASTY;  Surgeon: Eldred Manges, MD;  Location: [facility];  Service: Orthopedics;  Laterality: Right;   TOTAL SHOULDER ARTHROPLASTY  07/25/2011   Procedure: TOTAL SHOULDER ARTHROPLASTY;  Surgeon: Loreta Ave, MD;  Location: [facility];  Service: Orthopedics;  Laterality: Left;   Social History   Occupational History   Not on file  Tobacco Use   Smoking status: Never   Smokeless tobacco: Never  Vaping Use   Vaping Use: Never used  Substance and Sexual Activity  Alcohol use: Yes    Comment: occasionally   Drug use: No   Sexual activity: Not on file

## 2021-01-19 ENCOUNTER — Telehealth: Payer: Self-pay | Admitting: Radiology

## 2021-01-19 NOTE — Telephone Encounter (Signed)
Patient asked that work note be faxed to her FMLA and disability companies. New work note dated 01/18/2021 was faxed to Attn: Saddie Benders at [phone] and Attn: Anthem Life at [phone]. Both transmitted successfully.

## 2021-01-29 ENCOUNTER — Encounter: Payer: Self-pay | Admitting: Physical Medicine and Rehabilitation

## 2021-01-29 ENCOUNTER — Ambulatory Visit: Payer: Self-pay

## 2021-01-29 ENCOUNTER — Ambulatory Visit (INDEPENDENT_AMBULATORY_CARE_PROVIDER_SITE_OTHER): Payer: BC Managed Care – PPO | Admitting: Physical Medicine and Rehabilitation

## 2021-01-29 ENCOUNTER — Other Ambulatory Visit: Payer: Self-pay

## 2021-01-29 VITALS — BP 147/84 | HR 98

## 2021-01-29 DIAGNOSIS — M5412 Radiculopathy, cervical region: Secondary | ICD-10-CM

## 2021-01-29 MED ORDER — METHYLPREDNISOLONE ACETATE 80 MG/ML IJ SUSP
80.0000 mg | Freq: Once | INTRAMUSCULAR | Status: AC
  Administered 2021-01-29: 09:00:00 80 mg

## 2021-01-29 NOTE — Procedures (Signed)
Cervical Epidural Steroid Injection - Interlaminar Approach with Fluoroscopic Guidance  Patient: Mary Harvey      Date of Birth: 01-28-1956 MRN: [id number] PCP: Pcp, No      Visit Date: 01/29/2021   Universal Protocol:    Date/Time: 01/30/2211:39 PM  Consent Given By: the patient  Position: PRONE  Additional Comments: Vital signs were monitored before and after the procedure. Patient was prepped and draped in the usual sterile fashion. The correct patient, procedure, and site was verified.   Injection Procedure Details:   Procedure diagnoses: Cervical radiculopathy [M54.12]    Meds Administered:  Meds ordered this encounter  Medications   methylPREDNISolone acetate (DEPO-MEDROL) injection 80 mg     Laterality: Right  Location/Site: C7-T1  Needle: 3.5 in., 20 ga. Tuohy  Needle Placement: Paramedian epidural space  Findings:  -Comments: Excellent flow of contrast into the epidural space.  Procedure Details: Using a paramedian approach from the side mentioned above, the region overlying the inferior lamina was localized under fluoroscopic visualization and the soft tissues overlying this structure were infiltrated with 4 ml. of 1% Lidocaine without Epinephrine. A # 20 gauge, Tuohy needle was inserted into the epidural space using a paramedian approach.  The epidural space was localized using loss of resistance along with contralateral oblique bi-planar fluoroscopic views.  After negative aspirate for air, blood, and CSF, a 2 ml. volume of Isovue-250 was injected into the epidural space and the flow of contrast was observed. Radiographs were obtained for documentation purposes.   The injectate was administered into the level noted above.  Additional Comments:  The patient tolerated the procedure well Dressing: 2 x 2 sterile gauze and Band-Aid    Post-procedure details: Patient was observed during the procedure. Post-procedure instructions were  reviewed.  Patient left the clinic in stable condition.

## 2021-01-29 NOTE — Progress Notes (Signed)
[name] - 65 y.o. female MRN [id number]  Date of birth: Mar 03, 1955  Office Visit Note: Visit Date: 01/29/2021 PCP: Pcp, No Referred by: Magnus Sinning, MD  Subjective: Chief Complaint  Patient presents with   Neck - Pain   Right Shoulder - Pain   Left Shoulder - Pain   HPI:  Mary Harvey is a 65 y.o. female who comes in today for planned repeat Right C7-T1  Cervical Interlaminar epidural steroid injection with fluoroscopic guidance.  The patient has failed conservative care including home exercise, medications, time and activity modification.  This injection will be diagnostic and hopefully therapeutic.  Please see requesting physician notes for further details and justification. Patient received more than 50% pain relief from prior injection.  Patient's complaints are more shoulder pain with movement and mechanical complaints as well as some baseline shoulder neck pain.  She has a history of prior left total shoulder replacement as well as prior right arthroscopy.  She is also status post bilateral total knee replacements.  She will follow-up with Dr. Rodell Perna depending on relief with injection.  Referring: Dr. Rodell Perna   ROS Otherwise per HPI.  Assessment & Plan: Visit Diagnoses:    ICD-10-CM   1. Cervical radiculopathy  M54.12 XR C-ARM NO REPORT    Epidural Steroid injection    methylPREDNISolone acetate (DEPO-MEDROL) injection 80 mg      Plan: No additional findings.   Meds & Orders:  Meds ordered this encounter  Medications   methylPREDNISolone acetate (DEPO-MEDROL) injection 80 mg    Orders Placed This Encounter  Procedures   XR C-ARM NO REPORT   Epidural Steroid injection    Follow-up: Return in about 4 weeks (around 02/26/2021) for Rodell Perna, MD.   Procedures: No procedures performed  Cervical Epidural Steroid Injection - Interlaminar Approach with Fluoroscopic Guidance  Patient: [name]      Date of Birth: [birth year] MRN:  [id number] PCP: Pcp, No      Visit Date: 01/29/2021   Universal Protocol:    Date/Time: 01/30/2211:39 PM  Consent Given By: the patient  Position: PRONE  Additional Comments: Vital signs were monitored before and after the procedure. Patient was prepped and draped in the usual sterile fashion. The correct patient, procedure, and site was verified.   Injection Procedure Details:   Procedure diagnoses: Cervical radiculopathy [M54.12]    Meds Administered:  Meds ordered this encounter  Medications   methylPREDNISolone acetate (DEPO-MEDROL) injection 80 mg     Laterality: Right  Location/Site: C7-T1  Needle: 3.5 in., 20 ga. Tuohy  Needle Placement: Paramedian epidural space  Findings:  -Comments: Excellent flow of contrast into the epidural space.  Procedure Details: Using a paramedian approach from the side mentioned above, the region overlying the inferior lamina was localized under fluoroscopic visualization and the soft tissues overlying this structure were infiltrated with 4 ml. of 1% Lidocaine without Epinephrine. A # 20 gauge, Tuohy needle was inserted into the epidural space using a paramedian approach.  The epidural space was localized using loss of resistance along with contralateral oblique bi-planar fluoroscopic views.  After negative aspirate for air, blood, and CSF, a 2 ml. volume of Isovue-250 was injected into the epidural space and the flow of contrast was observed. Radiographs were obtained for documentation purposes.   The injectate was administered into the level noted above.  Additional Comments:  The patient tolerated the procedure well Dressing: 2 x 2 sterile gauze and Band-Aid  Post-procedure details: Patient was observed during the procedure. Post-procedure instructions were reviewed.  Patient left the clinic in stable condition.   Clinical History: MRI CERVICAL SPINE WITHOUT CONTRAST   TECHNIQUE: Multiplanar, multisequence MR imaging  of the cervical spine was performed. No intravenous contrast was administered.   COMPARISON:  02/06/2019   FINDINGS: Alignment: Degenerative reversal of cervical lordosis.   Vertebrae: No fracture, evidence of discitis, or bone lesion.   Cord: Normal signal and morphology.   Posterior Fossa, vertebral arteries, paraspinal tissues: Subcutaneous scar-like appearance posterior to the C2 vertebra.   Partially empty sella with prominent sellar enlargement, unchanged 2020 and incidental to the history.   Disc levels:   C2-3: Unremarkable.   C3-4: Shallow central protrusion   C4-5: Disc narrowing with downward pointing extrusion contacting but not compressing the cord. Mild right foraminal narrowing.   C5-6: Disc narrowing and bulging with buttressing osteophytes. Shallow right paracentral protrusion. The canal and foramina are patent   C6-7: Disc narrowing with downward pointing extrusion contacting but not compressing the cord. Mild right foraminal narrowing.   C7-T1:Borderline disc bulging.   IMPRESSION: 1. 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 There is a degenerative reversal of cervical lordosis with mild C7-T1 anterolisthesis that is progressive. 2. No high-grade foraminal narrowing.     Electronically Signed   By: Marnee Spring M.D.   On: 06/18/2020 20:09     Objective:  VS:  HT:     WT:    BMI:      BP:(!) 147/84   HR:98bpm   TEMP: ( )   RESP:  Physical Exam Vitals and nursing note reviewed.  Constitutional:      General: She is not in acute distress.    Appearance: Normal appearance. She is not ill-appearing.  HENT:     Head: Normocephalic and atraumatic.     Right Ear: External ear normal.     Left Ear: External ear normal.  Eyes:     Extraocular Movements: Extraocular movements intact.  Cardiovascular:     Rate and Rhythm: Normal rate.     Pulses: Normal pulses.  Musculoskeletal:      Cervical back: Tenderness present. No rigidity.     Right lower leg: No edema.     Left lower leg: No edema.     Comments: Patient has good strength in the upper extremities including 5 out of 5 strength in wrist extension long finger flexion and APB.  There is no atrophy of the hands intrinsically.  There is a negative Hoffmann's test.   Lymphadenopathy:     Cervical: No cervical adenopathy.  Skin:    Findings: No erythema, lesion or rash.  Neurological:     General: No focal deficit present.     Mental Status: She is alert and oriented to person, place, and time.     Sensory: No sensory deficit.     Motor: No weakness or abnormal muscle tone.     Coordination: Coordination normal.  Psychiatric:        Mood and Affect: Mood normal.        Behavior: Behavior normal.     Imaging: No results found.

## 2021-01-29 NOTE — Patient Instructions (Signed)

## 2021-01-29 NOTE — Progress Notes (Signed)
Pt state neck pain that travels down both shoulder and arm, mostly her right shoulder.  Pt state her hands feels numb. Pt state when she reach for item or bending over she has pain.  Numeric Pain Rating Scale and Functional Assessment Average Pain 7   In the last MONTH (on 0-10 scale) has pain interfered with the following?  1. General activity like being  able to carry out your everyday physical activities such as walking, climbing stairs, carrying groceries, or moving a chair?  Rating(9)   +Driver, -BT, -Dye Allergies.

## 2021-02-23 ENCOUNTER — Ambulatory Visit: Payer: BC Managed Care – PPO | Admitting: Orthopaedic Surgery

## 2021-02-23 ENCOUNTER — Encounter: Payer: Self-pay | Admitting: Orthopaedic Surgery

## 2021-02-23 ENCOUNTER — Other Ambulatory Visit: Payer: Self-pay

## 2021-02-23 VITALS — BP 109/66 | Ht 64.0 in | Wt 206.0 lb

## 2021-02-23 DIAGNOSIS — Z96651 Presence of right artificial knee joint: Secondary | ICD-10-CM

## 2021-02-23 DIAGNOSIS — M6281 Muscle weakness (generalized): Secondary | ICD-10-CM

## 2021-02-23 NOTE — Progress Notes (Signed)
Office Visit Note   Patient: Mary Harvey           Date of Birth: 03/08/1955           MRN: [id number] Visit Date: 02/23/2021              Requested by: No referring provider defined for this encounter. PCP: Pcp, No   Assessment & Plan: Visit Diagnoses:  1. History of total knee arthroplasty, right   2. Quadriceps weakness     Plan: Work slip given excusing her from yesterday and today.  She will need further quad strengthening since her quad fatigues out by the end of the day.  We reviewed previous x-rays showed good position alignment.  We went over multiple exercises as we had before but quad strengthening.  She understands when she gets her quad strong will not fatigue out by the end of the day with walking.  Recheck 6 weeks if she is having persistent problems.  She will call if she like formal physical therapy.  She understands exercises she needs to do and has done them in the past.  Follow-Up Instructions: No follow-ups on file.   Orders:  No orders of the defined types were placed in this encounter.  No orders of the defined types were placed in this encounter.     Procedures: No procedures performed   Clinical Data: No additional findings.   Subjective: Chief Complaint  Patient presents with   Right Knee - Pain    11/06/2020 Right TKA    HPI 66 year old female 4 months post right total knee arthroplasty.  Left total knee was done previously without problems.  She went back to work work last week made it Monday Tuesday Wednesday and states she could hardly walk yesterday.  She is on her feet ambulating making sure machines are running properly.  She is use some ibuprofen with some relief.  Gradually as the day progressed started limping more.  She states she walks for 9 hours and she works.  Review of Systems all other systems noncontributory to HPI.  No associated back pain no numbness or tingling no bowel bladder symptoms.  No groin  pain.   Objective: Vital Signs: BP 109/66    Ht 5\' 4"  (1.626 m)    Wt 206 lb (93.4 kg)    BMI 35.36 kg/m   Physical Exam Constitutional:      Appearance: She is well-developed.  HENT:     Head: Normocephalic.     Right Ear: External ear normal.     Left Ear: External ear normal. There is no impacted cerumen.  Eyes:     Pupils: Pupils are equal, round, and reactive to light.  Neck:     Thyroid: No thyromegaly.     Trachea: No tracheal deviation.  Cardiovascular:     Rate and Rhythm: Normal rate.  Pulmonary:     Effort: Pulmonary effort is normal.  Abdominal:     Palpations: Abdomen is soft.  Musculoskeletal:     Cervical back: No rigidity.  Skin:    General: Skin is warm and dry.  Neurological:     Mental Status: She is alert and oriented to person, place, and time.  Psychiatric:        Behavior: Behavior normal.    Ortho Exam patient has strong quad on the left slight weakness on the right against resisted testing no extension lag.  Good flexion.  No swelling of the knee.  Some  tenderness over the quad muscle with palpation.  Specialty Comments:  No specialty comments available.  Imaging: No results found.   PMFS History: Patient Active Problem List   Diagnosis Date Noted   Quadriceps weakness 02/26/2021   History of total knee arthroplasty, right 12/14/2020   Other spondylosis with radiculopathy, cervical region 01/13/2019   Chronic neck pain 12/22/2018   History of total knee arthroplasty, left 07/21/2018   Past Medical History:  Diagnosis Date   Arthritis    Asthma    Chronic pain    Contact lens/glasses fitting    wears contacts or glasses   GERD (gastroesophageal reflux disease)    Hypertension    Hypothyroidism    Insomnia    Mini stroke    ~2010   Seasonal allergies    Sleep apnea    CPAP @ night   Wears partial dentures    top and bottom partials    No family history on file.  Past Surgical History:  Procedure Laterality Date    ABDOMINAL HYSTERECTOMY     CHOLECYSTECTOMY     KNEE ARTHROSCOPY Left 04/09/2007   KNEE ARTHROSCOPY WITH MEDIAL MENISECTOMY Right 07/30/2012   Procedure: KNEE ARTHROSCOPY WITH MEDIAL AND LATERAL MENISECTOMIES, CHONDROPLASTY OF PATELLA;  Surgeon: Loreta Ave, MD;  Location: [facility];  Service: Orthopedics;  Laterality: Right;   SHOULDER ARTHROSCOPY  09/12/2011   right   TONSILLECTOMY     TOTAL KNEE ARTHROPLASTY Left 05/11/2008   TOTAL KNEE ARTHROPLASTY Right 11/06/2020   Procedure: RIGHT TOTAL KNEE ARTHROPLASTY;  Surgeon: Eldred Manges, MD;  Location: [facility];  Service: Orthopedics;  Laterality: Right;   TOTAL SHOULDER ARTHROPLASTY  07/25/2011   Procedure: TOTAL SHOULDER ARTHROPLASTY;  Surgeon: Loreta Ave, MD;  Location: [facility];  Service: Orthopedics;  Laterality: Left;   Social History   Occupational History   Not on file  Tobacco Use   Smoking status: Never   Smokeless tobacco: Never  Vaping Use   Vaping Use: Never used  Substance and Sexual Activity   Alcohol use: Yes    Comment: occasionally   Drug use: No   Sexual activity: Not on file

## 2021-02-26 ENCOUNTER — Encounter: Payer: Self-pay | Admitting: Orthopaedic Surgery

## 2021-02-26 DIAGNOSIS — M6281 Muscle weakness (generalized): Secondary | ICD-10-CM | POA: Insufficient documentation

## 2021-02-28 IMAGING — MR MR CERVICAL SPINE W/O CM
4 of 5 series · 27 of 48 positions shown · non-contrast
Comparison: Cervical spine radiographs 01/13/2019. [REDACTED] cervical MRI 05/02/2011.

CLINICAL DATA: 63-year-old female with neck pain radiating to both
shoulders and arms with bilateral hand numbness and cramping.
Progressive chronic symptoms. No known injury.

EXAM:
MRI CERVICAL SPINE WITHOUT CONTRAST
TECHNIQUE: Multiplanar, multisequence MR imaging of the cervical spine was
performed. No intravenous contrast was administered.

[Series 4: T2 · sagittal · 3.0mm · 0.66mm/px · 6 of 13 slices shown (1 of 2)]
[im 1/13]
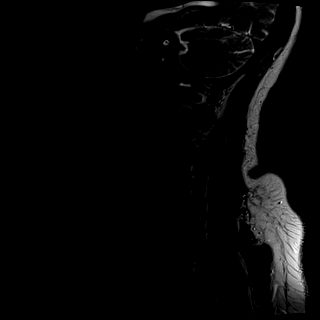
[im 3/13]
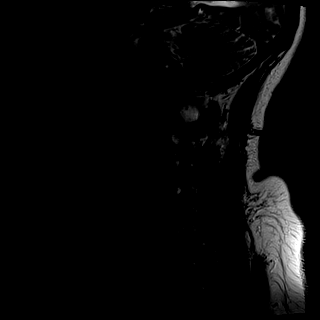
[im 5/13]
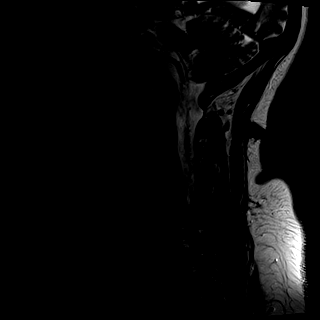
[im 8/13]
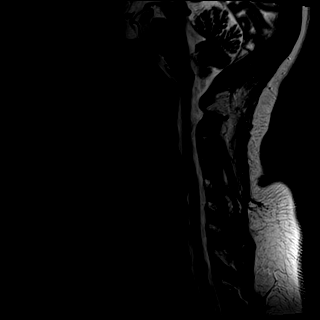
[im 10/13]
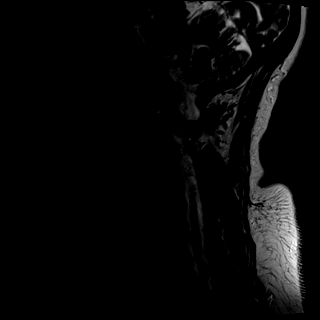
[im 13/13]
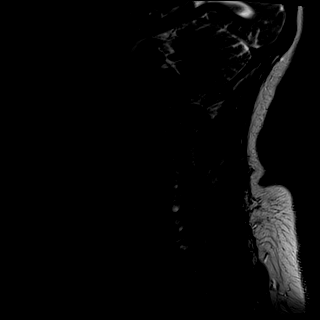

[Series 5: T1 · sagittal · 3.0mm · 0.41mm/px · 7 of 13 slices shown]
[im 1/13]
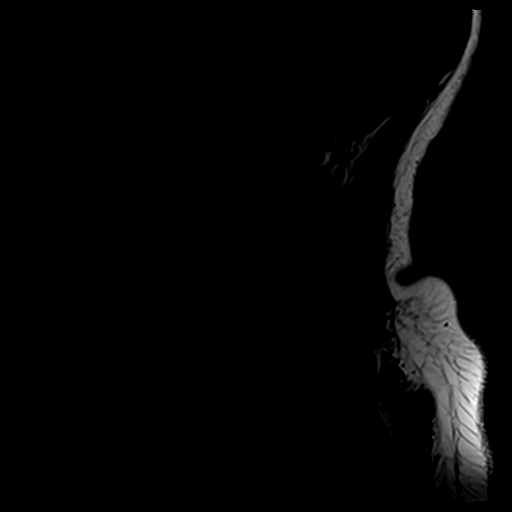
[im 3/13]
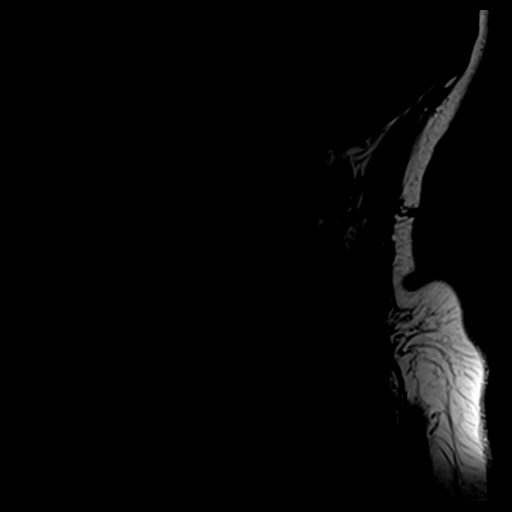
[im 5/13]
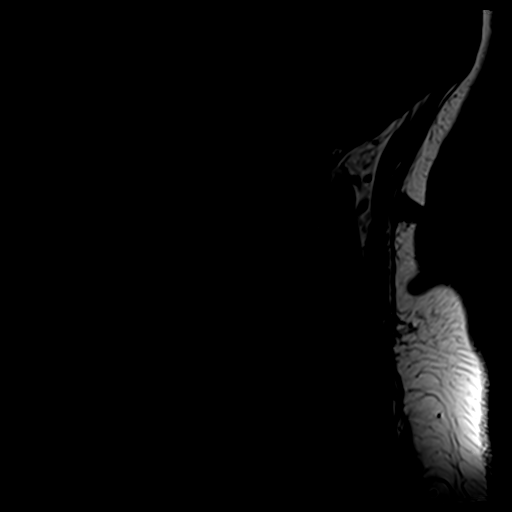
[im 7/13]
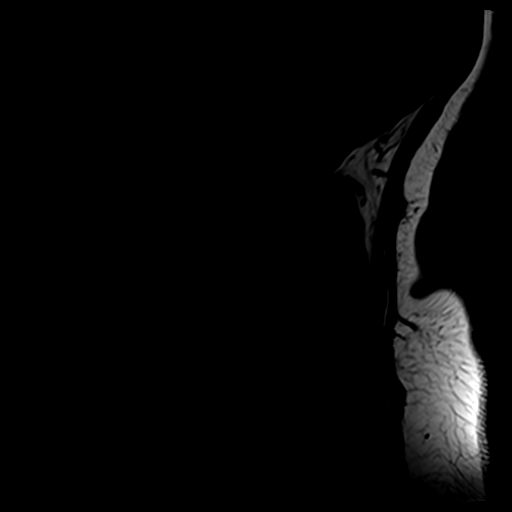
[im 9/13]
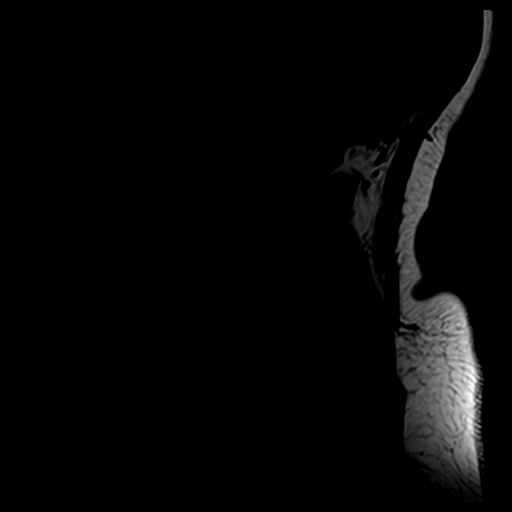
[im 11/13]
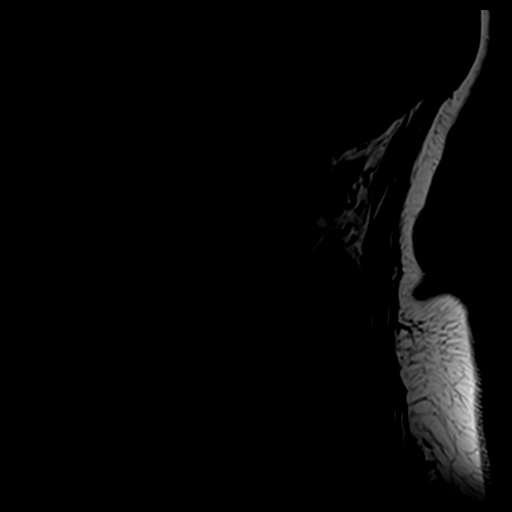
[im 13/13]
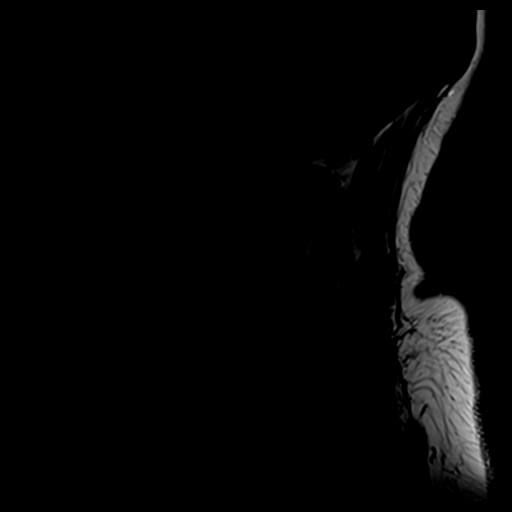

[Series 6: GRE · axial · 3.0mm · 0.35mm/px · z∈[-70,+10]mm · 6 of 28 slices shown]
[im 1/28]
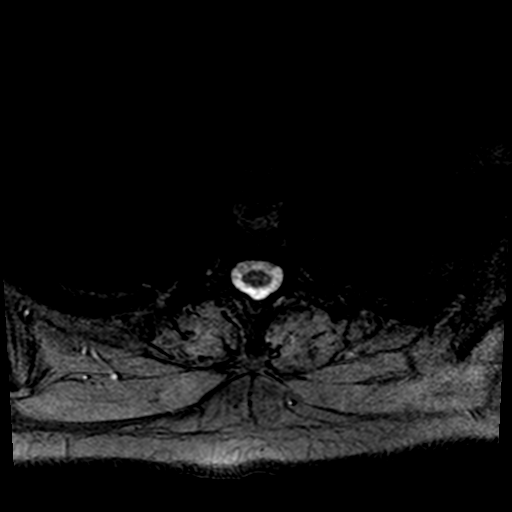
[im 5/28]
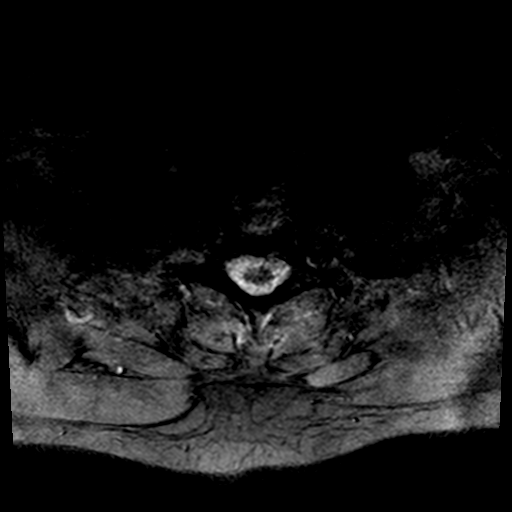
[im 9/28]
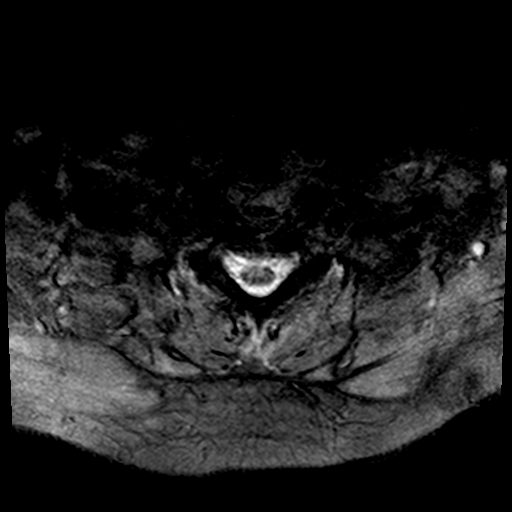
[im 13/28]
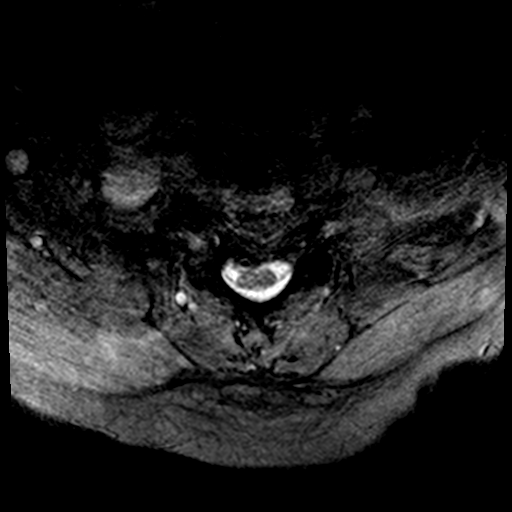
[im 15/28]
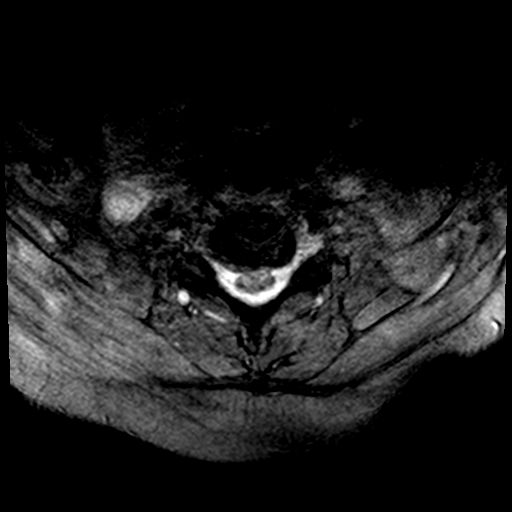
[im 23/28]
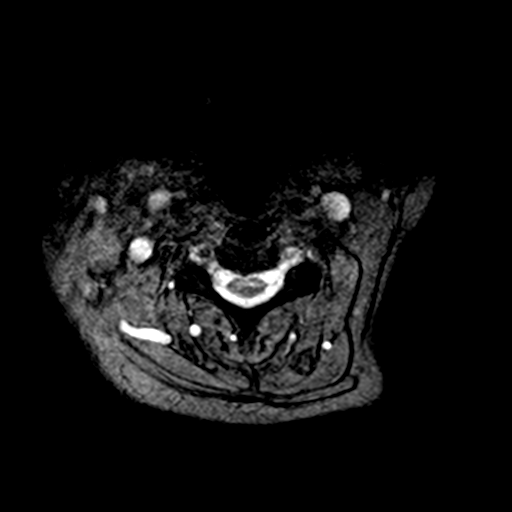

[Series 7: T2 · axial · 3.0mm · 0.70mm/px · z∈[-70,+29]mm · 8 of 28 slices shown (2 of 2)]
[im 1/28]
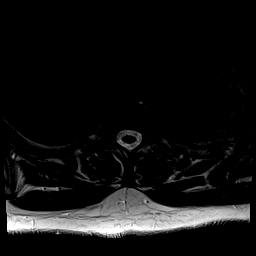
[im 5/28]
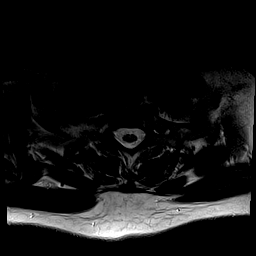
[im 9/28]
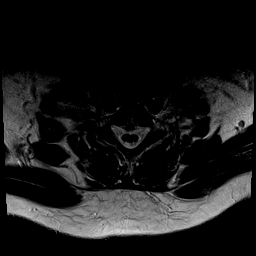
[im 13/28]
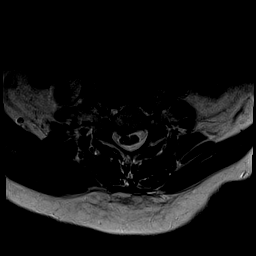
[im 15/28]
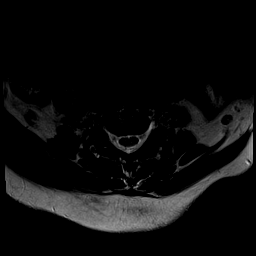
[im 19/28]
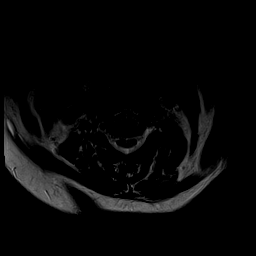
[im 23/28]
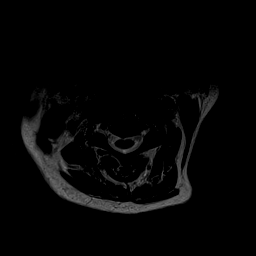
[im 28/28]
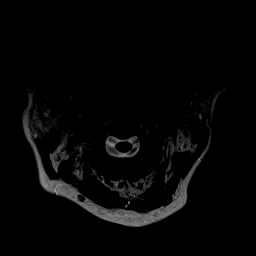

[27 of 48 positions shown; findings below may reference images not displayed]

FINDINGS: Alignment: Straightening of cervical lordosis has not significantly
changed since 6065. No spondylolisthesis.

Vertebrae: No marrow edema or evidence of acute osseous abnormality.
Chronic degenerative endplate marrow signal changes from C4
inferiorly. Background bone marrow signal within normal limits.

Cord: Spinal cord signal is within normal limits at all visualized
levels. Fairly capacious spinal canal.

Posterior Fossa, vertebral arteries, paraspinal tissues:
Cervicomedullary junction is within normal limits. Negative visible
posterior fossa. Prominent partially empty sella (series 4, image
8), better visualized than in 6065 but appears to be chronic.

Stable major vascular flow voids in the neck. The right vertebral
artery appears mildly dominant. Negative visible neck soft tissues
and lung apices.

Disc levels:

C2-C3:  Negative.

C3-C4:  Negative aside from mild left facet hypertrophy.

C4-C5: Disc space loss with posterior mildly lobulated disc
protrusions, chronic but increased since 6065. Effaced ventral CSF
space with mild new ventral cord mass effect although the dorsal CSF
space remains patent (series 4, image 18). Superimposed
circumferential disc bulge and endplate spurring. Mild right C5
foraminal stenosis appears stable.

C5-C6: Disc space loss with chronic broad-based right paracentral
disc protrusion. Superimposed circumferential disc bulge and
endplate spurring. Chronic effaced ventral cord and mild right hemi
cord mass effect appear not significantly changed. Dorsal CSF space
remains patent. Mild to moderate right C6 foraminal stenosis appears
stable.

C6-C7: Disc space loss with chronic but mildly increased broad-based
central and slightly caudal disc extrusion (series 7, image 19).
Ventral cord is effaced but there is no significant spinal stenosis.
Mild to moderate right C7 foraminal stenosis does appear increased,
in part related to right far lateral disc osteophyte complex.

C7-T1:  Mild facet hypertrophy. No stenosis.

Mild upper thoracic facet hypertrophy and disc bulging appears
stable.
IMPRESSION: 1. Chronic cervical disc and endplate degeneration from C4-C5 to
C6-C7 has increased since 6065 and does efface the ventral spinal
cord, but the cervical canal is capacious and there is no
significant spinal stenosis. Up to mild associated ventral cord mass
effect but no cord signal abnormality.

2. Up to moderate multifactorial right C7 neural foraminal stenosis
does appear increased since 6065. But mild to moderate right C5 and
C6 foraminal stenosis is stable.

## 2021-04-06 ENCOUNTER — Ambulatory Visit: Payer: BC Managed Care – PPO | Admitting: Orthopaedic Surgery

## 2021-06-22 ENCOUNTER — Telehealth: Payer: Self-pay | Admitting: Physical Medicine and Rehabilitation

## 2021-06-22 NOTE — Telephone Encounter (Signed)
IC patient. She actually was requesting neck injection. Advised she should discuss with Dr Ophelia Charter at [facility] appt on 05/25 to see if he feels would be beneficial for patient. She verbalized understanding.  ?

## 2021-06-22 NOTE — Telephone Encounter (Signed)
Pt called requesting a call back for right shoulder injection. Please call pt at [phone]. ?

## 2021-07-05 ENCOUNTER — Encounter: Payer: Self-pay | Admitting: Orthopaedic Surgery

## 2021-07-05 ENCOUNTER — Ambulatory Visit (INDEPENDENT_AMBULATORY_CARE_PROVIDER_SITE_OTHER): Payer: BC Managed Care – PPO | Admitting: Orthopaedic Surgery

## 2021-07-05 VITALS — Ht 64.0 in | Wt 201.0 lb

## 2021-07-05 DIAGNOSIS — M542 Cervicalgia: Secondary | ICD-10-CM

## 2021-07-05 NOTE — Progress Notes (Signed)
Office Visit Note   Patient: Mary Harvey           Date of Birth: 25-Nov-1955           MRN: [id number] Visit Date: 07/05/2021              Requested by: No referring provider defined for this encounter. PCP: Pcp, No   Assessment & Plan: Visit Diagnoses: No diagnosis found.  Plan: Once again we went over the fact that her quad is still weak and she needs to do more quad strengthening to get a good result.  We discussed leg lifts with the purse around her ankle which today she could do 1 maybe 2 and on the opposite left leg she can do multiple leg raises without weakness.  We discussed single stance toe touch.  Knee extension on the machines leg presses with machines, wall squats.  I wrote all these exercises down we went over how to do them.  She can return to see me in 2 months.  We will schedule cervical epidural for C4-5 disc protrusion.  Follow-Up Instructions: No follow-ups on file.   Orders:  No orders of the defined types were placed in this encounter.  No orders of the defined types were placed in this encounter.     Procedures: No procedures performed   Clinical Data: No additional findings.   Subjective: Chief Complaint  Patient presents with   Neck - Pain, Follow-up   Right Knee - Pain, Follow-up    HPI 66 year old female returns post right knee arthroplasty 11/06/2020.  Been a month since her surgery she states at the afternoon her knee hyperextends she has problems with stairs still has quad weakness despite second round of therapy.  She states she thinks she went back to work too early.  X-rays look good she cannot do a single step up with her operative right leg first with hop step.  Previous total knee arthroplasty left leg did better.  Patient states she also called by getting a cervical epidural where she has some disc protrusion at C4-5 and was told she had to come back to see me again.  She is back working.  She uses Aleve and occasionally oxycodone.   I can overcome her right quad sitting position with 1 or 2 fingers opposite  Review of Systems updated unchanged.  Cervical spondylosis with some canal narrowing C4-5.   Objective: Vital Signs: Ht 5\' 4"  (1.626 m)   Wt 201 lb (91.2 kg)   BMI 34.50 kg/m   Physical Exam Constitutional:      Appearance: She is well-developed.  HENT:     Head: Normocephalic.     Right Ear: External ear normal.     Left Ear: External ear normal. There is no impacted cerumen.  Eyes:     Pupils: Pupils are equal, round, and reactive to light.  Neck:     Thyroid: No thyromegaly.     Trachea: No tracheal deviation.  Cardiovascular:     Rate and Rhythm: Normal rate.  Pulmonary:     Effort: Pulmonary effort is normal.  Abdominal:     Palpations: Abdomen is soft.  Musculoskeletal:     Cervical back: No rigidity.  Skin:    General: Skin is warm and dry.  Neurological:     Mental Status: She is alert and oriented to person, place, and time.  Psychiatric:        Behavior: Behavior normal.    Ortho  Exam right quad weakness negative logroll the hips abductors are strong sensory testing shows no deficit.  Anterior tib gastrocsoleus is strong right and left.  Left quad is strong.  She has a hop step with hip flexed trying to get up on a step right leg first.  Patient has bilateral brachial plexus tenderness increased pain with cervical compression.  No lower extremity clonus.  Specialty Comments:  No specialty comments available.  Imaging: CLINICAL DATA:  Neck pain extending down both arms with pain for 2 years, worsening   EXAM: MRI CERVICAL SPINE WITHOUT CONTRAST   TECHNIQUE: Multiplanar, multisequence MR imaging of the cervical spine was performed. No intravenous contrast was administered.   COMPARISON:  02/06/2019   FINDINGS: Alignment: Degenerative reversal of cervical lordosis.   Vertebrae: No fracture, evidence of discitis, or bone lesion.   Cord: Normal signal and morphology.    Posterior Fossa, vertebral arteries, paraspinal tissues: Subcutaneous scar-like appearance posterior to the C2 vertebra.   Partially empty sella with prominent sellar enlargement, unchanged 2020 and incidental to the history.   Disc levels:   C2-3: Unremarkable.   C3-4: Shallow central protrusion   C4-5: Disc narrowing with downward pointing extrusion contacting but not compressing the cord. Mild right foraminal narrowing.   C5-6: Disc narrowing and bulging with buttressing osteophytes. Shallow right paracentral protrusion. The canal and foramina are patent   C6-7: Disc narrowing with downward pointing extrusion contacting but not compressing the cord. Mild right foraminal narrowing.   C7-T1:Borderline disc bulging.   IMPRESSION: 1. 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 There is a degenerative reversal of cervical lordosis with mild C7-T1 anterolisthesis that is progressive. 2. No high-grade foraminal narrowing.     Electronically Signed   By: Monte Fantasia M.D.   On: 06/18/2020 20:09   PMFS History: Patient Active Problem List   Diagnosis Date Noted   Quadriceps weakness 02/26/2021   History of total knee arthroplasty, right 12/14/2020   Other spondylosis with radiculopathy, cervical region 01/13/2019   Chronic neck pain 12/22/2018   History of total knee arthroplasty, left 07/21/2018   Past Medical History:  Diagnosis Date   Arthritis    Asthma    Chronic pain    Contact lens/glasses fitting    wears contacts or glasses   GERD (gastroesophageal reflux disease)    Hypertension    Hypothyroidism    Insomnia    Mini stroke    ~2010   Seasonal allergies    Sleep apnea    CPAP @ night   Wears partial dentures    top and bottom partials    No family history on file.  Past Surgical History:  Procedure Laterality Date   ABDOMINAL HYSTERECTOMY     CHOLECYSTECTOMY     KNEE ARTHROSCOPY Left  04/09/2007   KNEE ARTHROSCOPY WITH MEDIAL MENISECTOMY Right 07/30/2012   Procedure: KNEE ARTHROSCOPY WITH MEDIAL AND LATERAL MENISECTOMIES, CHONDROPLASTY OF PATELLA;  Surgeon: Ninetta Lights, MD;  Location: [facility];  Service: Orthopedics;  Laterality: Right;   SHOULDER ARTHROSCOPY  09/12/2011   right   TONSILLECTOMY     TOTAL KNEE ARTHROPLASTY Left 05/11/2008   TOTAL KNEE ARTHROPLASTY Right 11/06/2020   Procedure: RIGHT TOTAL KNEE ARTHROPLASTY;  Surgeon: Marybelle Killings, MD;  Location: [facility];  Service: Orthopedics;  Laterality: Right;   TOTAL SHOULDER ARTHROPLASTY  07/25/2011   Procedure: TOTAL SHOULDER ARTHROPLASTY;  Surgeon: Ninetta Lights, MD;  Location: [facility];  Service: Orthopedics;  Laterality: Left;   Social History   Occupational History   Not on file  Tobacco Use   Smoking status: Never   Smokeless tobacco: Never  Vaping Use   Vaping Use: Never used  Substance and Sexual Activity   Alcohol use: Yes    Comment: occasionally   Drug use: No   Sexual activity: Not on file

## 2021-08-07 ENCOUNTER — Ambulatory Visit: Payer: BC Managed Care – PPO | Admitting: Physical Medicine and Rehabilitation

## 2021-08-07 ENCOUNTER — Encounter: Payer: Self-pay | Admitting: Physical Medicine and Rehabilitation

## 2021-08-07 ENCOUNTER — Ambulatory Visit: Payer: Self-pay

## 2021-08-07 VITALS — BP 112/55 | HR 102

## 2021-08-07 DIAGNOSIS — M5416 Radiculopathy, lumbar region: Secondary | ICD-10-CM | POA: Diagnosis not present

## 2021-08-07 MED ORDER — METHYLPREDNISOLONE ACETATE 80 MG/ML IJ SUSP
80.0000 mg | Freq: Once | INTRAMUSCULAR | Status: AC
  Administered 2021-08-07: 80 mg

## 2021-08-07 NOTE — Progress Notes (Signed)
Pt state neck pain that travels to her right shoulder that makes her hand numb also. Pt state bending and lifting makes the pain worse. Pt state she uses ice and takes pain meds to help ease her pain.  Numeric Pain Rating Scale and Functional Assessment Average Pain 4   In the last MONTH (on 0-10 scale) has pain interfered with the following?  1. General activity like being  able to carry out your everyday physical activities such as walking, climbing stairs, carrying groceries, or moving a chair?  Rating(10)   +Driver, -BT, -Dye Allergies.

## 2021-08-08 ENCOUNTER — Other Ambulatory Visit: Payer: Self-pay | Admitting: Endocrinology

## 2021-08-08 DIAGNOSIS — E049 Nontoxic goiter, unspecified: Secondary | ICD-10-CM

## 2021-08-16 ENCOUNTER — Ambulatory Visit
Admission: RE | Admit: 2021-08-16 | Discharge: 2021-08-16 | Disposition: A | Discharge status: Home / Self Care | Payer: BC Managed Care – PPO | Source: Ambulatory Visit | Attending: Endocrinology | Admitting: Endocrinology

## 2021-08-16 DIAGNOSIS — E049 Nontoxic goiter, unspecified: Secondary | ICD-10-CM

## 2021-09-04 NOTE — Progress Notes (Signed)
Mary Harvey - 66 y.o. female MRN [id number]  Date of birth: Sep 27, 1955  Office Visit Note: Visit Date: 08/07/2021 PCP: Simona Huh, FNP Referred by: Eldred Manges, MD  Subjective: Chief Complaint  Patient presents with   Neck - Pain   Right Shoulder - Pain   Right Hand - Numbness   HPI:  Mary Harvey is a 66 y.o. female who comes in today at the request of Dr. Annell Greening for planned Right C7-T1 Lumbar Interlaminar epidural steroid injection with fluoroscopic guidance.  The patient has failed conservative care including home exercise, medications, time and activity modification.  This injection will be diagnostic and hopefully therapeutic.  Please see requesting physician notes for further details and justification.   ROS Otherwise per HPI.  Assessment & Plan: Visit Diagnoses:    ICD-10-CM   1. Lumbar radiculopathy  M54.16 XR C-ARM NO REPORT    Epidural Steroid injection    methylPREDNISolone acetate (DEPO-MEDROL) injection 80 mg      Plan: No additional findings.   Meds & Orders:  Meds ordered this encounter  Medications   methylPREDNISolone acetate (DEPO-MEDROL) injection 80 mg    Orders Placed This Encounter  Procedures   XR C-ARM NO REPORT   Epidural Steroid injection    Follow-up: Return if symptoms worsen or fail to improve.   Procedures: No procedures performed  Cervical Epidural Steroid Injection - Interlaminar Approach with Fluoroscopic Guidance  Patient: Mary Harvey      Date of Birth: Jun 13, 1955 MRN: [id number] PCP: Simona Huh, FNP      Visit Date: 08/07/2021   Universal Protocol:    Date/Time: 07/25/238:36 PM  Consent Given By: the patient  Position: PRONE  Additional Comments: Vital signs were monitored before and after the procedure. Patient was prepped and draped in the usual sterile fashion. The correct patient, procedure, and site was verified.   Injection Procedure Details:   Procedure diagnoses:  Lumbar radiculopathy [M54.16]    Meds Administered:  Meds ordered this encounter  Medications   methylPREDNISolone acetate (DEPO-MEDROL) injection 80 mg     Laterality: Right  Location/Site: C7-T1  Needle: 3.5 in., 20 ga. Tuohy  Needle Placement: Paramedian epidural space  Findings:  -Comments: Excellent flow of contrast into the epidural space.  Procedure Details: Using a paramedian approach from the side mentioned above, the region overlying the inferior lamina was localized under fluoroscopic visualization and the soft tissues overlying this structure were infiltrated with 4 ml. of 1% Lidocaine without Epinephrine. A # 20 gauge, Tuohy needle was inserted into the epidural space using a paramedian approach.  The epidural space was localized using loss of resistance along with contralateral oblique bi-planar fluoroscopic views.  After negative aspirate for air, blood, and CSF, a 2 ml. volume of Isovue-250 was injected into the epidural space and the flow of contrast was observed. Radiographs were obtained for documentation purposes.   The injectate was administered into the level noted above.  Additional Comments:  The patient tolerated the procedure well Dressing: 2 x 2 sterile gauze and Band-Aid    Post-procedure details: Patient was observed during the procedure. Post-procedure instructions were reviewed.  Patient left the clinic in stable condition.   Clinical History: No specialty comments available.     Objective:  VS:  HT:    WT:   BMI:     BP:(!) 112/55  HR:(!) 102bpm  TEMP: ( )  RESP:  Physical Exam Vitals and nursing note reviewed.  Constitutional:      General: She is not in acute distress.    Appearance: Normal appearance. She is not ill-appearing.  HENT:     Head: Normocephalic and atraumatic.     Right Ear: External ear normal.     Left Ear: External ear normal.  Eyes:     Extraocular Movements: Extraocular movements intact.  Cardiovascular:      Rate and Rhythm: Normal rate.     Pulses: Normal pulses.  Musculoskeletal:     Cervical back: Tenderness present. No rigidity.     Right lower leg: No edema.     Left lower leg: No edema.     Comments: Patient has good strength in the upper extremities including 5 out of 5 strength in wrist extension long finger flexion and APB.  There is no atrophy of the hands intrinsically.  There is a negative Hoffmann's test.   Lymphadenopathy:     Cervical: No cervical adenopathy.  Skin:    Findings: No erythema, lesion or rash.  Neurological:     General: No focal deficit present.     Mental Status: She is alert and oriented to person, place, and time.     Sensory: No sensory deficit.     Motor: No weakness or abnormal muscle tone.     Coordination: Coordination normal.  Psychiatric:        Mood and Affect: Mood normal.        Behavior: Behavior normal.      Imaging: No results found.

## 2021-09-04 NOTE — Procedures (Signed)
Cervical Epidural Steroid Injection - Interlaminar Approach with Fluoroscopic Guidance  Patient: Mary Harvey      Date of Birth: 1955/09/09 MRN: [id number] PCP: Simona Huh, FNP      Visit Date: 08/07/2021   Universal Protocol:    Date/Time: 07/25/238:36 PM  Consent Given By: the patient  Position: PRONE  Additional Comments: Vital signs were monitored before and after the procedure. Patient was prepped and draped in the usual sterile fashion. The correct patient, procedure, and site was verified.   Injection Procedure Details:   Procedure diagnoses: Lumbar radiculopathy [M54.16]    Meds Administered:  Meds ordered this encounter  Medications   methylPREDNISolone acetate (DEPO-MEDROL) injection 80 mg     Laterality: Right  Location/Site: C7-T1  Needle: 3.5 in., 20 ga. Tuohy  Needle Placement: Paramedian epidural space  Findings:  -Comments: Excellent flow of contrast into the epidural space.  Procedure Details: Using a paramedian approach from the side mentioned above, the region overlying the inferior lamina was localized under fluoroscopic visualization and the soft tissues overlying this structure were infiltrated with 4 ml. of 1% Lidocaine without Epinephrine. A # 20 gauge, Tuohy needle was inserted into the epidural space using a paramedian approach.  The epidural space was localized using loss of resistance along with contralateral oblique bi-planar fluoroscopic views.  After negative aspirate for air, blood, and CSF, a 2 ml. volume of Isovue-250 was injected into the epidural space and the flow of contrast was observed. Radiographs were obtained for documentation purposes.   The injectate was administered into the level noted above.  Additional Comments:  The patient tolerated the procedure well Dressing: 2 x 2 sterile gauze and Band-Aid    Post-procedure details: Patient was observed during the procedure. Post-procedure instructions were  reviewed.  Patient left the clinic in stable condition.

## 2021-12-06 ENCOUNTER — Ambulatory Visit: Payer: Self-pay | Admitting: Orthopaedic Surgery

## 2022-05-03 ENCOUNTER — Ambulatory Visit: Payer: 59 | Admitting: Orthopaedic Surgery

## 2022-05-03 ENCOUNTER — Encounter: Payer: Self-pay | Admitting: Orthopaedic Surgery

## 2022-05-03 ENCOUNTER — Other Ambulatory Visit: Payer: Self-pay

## 2022-05-03 VITALS — BP 132/78 | HR 94 | Ht 64.0 in | Wt 202.0 lb

## 2022-05-03 DIAGNOSIS — M6281 Muscle weakness (generalized): Secondary | ICD-10-CM

## 2022-05-03 DIAGNOSIS — M25561 Pain in right knee: Secondary | ICD-10-CM | POA: Diagnosis not present

## 2022-05-03 DIAGNOSIS — Z96651 Presence of right artificial knee joint: Secondary | ICD-10-CM

## 2022-05-03 DIAGNOSIS — G8929 Other chronic pain: Secondary | ICD-10-CM

## 2022-05-03 NOTE — Progress Notes (Signed)
Office Visit Note   Patient: Mary Harvey           Date of Birth: Jun 24, 1955           MRN: [id number] Visit Date: 05/03/2022              Requested by: McAlexander, [place] [place], [place] [id number] [place] [address] PCP: Cornelious Bryant, FNP   Assessment & Plan: Visit Diagnoses:  1. Chronic pain of right knee   2. History of total knee arthroplasty, right   3. Quadriceps weakness     Plan: Patient like to go to [facility] in [facility] for therapy for quad strengthening for residual quad weakness post total knee arthroplasty.  Again we went over several exercises that we did last year.  Think she will need some formal therapy to fix the problem and I will recheck her in 3 months.  Follow-Up Instructions: Return in about 3 months (around 08/03/2022).   Orders:  Orders Placed This Encounter  Procedures   XR Knee 1-2 Views Right   Ambulatory referral to Physical Therapy   No orders of the defined types were placed in this encounter.     Procedures: No procedures performed   Clinical Data: No additional findings.   Subjective: Chief Complaint  Patient presents with   Right Knee - Pain    HPI 67 year old female returns now almost 2 years post total knee arthroplasty.  I saw her 1 year ago with the same problem she is ambulating which is quad weakness and her knee buckling at the end of the day.  She notices some swelling she ices it daily.  She is on her feet at work a lot.  Previous left total knee arthroplasty has slight quad weakness but is significantly stronger than her right.  Review of Systems all other systems noncontributory to HPI.   Objective: Vital Signs: BP 132/78   Pulse 94   Ht 5\' 4"  (1.626 m)   Wt 202 lb (91.6 kg)   BMI 34.67 kg/m   Physical Exam Constitutional:      Appearance: She is well-developed.  HENT:     Head: Normocephalic.     Right Ear: External ear normal.     Left Ear:  External ear normal. There is no impacted cerumen.  Eyes:     Pupils: Pupils are equal, round, and reactive to light.  Neck:     Thyroid: No thyromegaly.     Trachea: No tracheal deviation.  Cardiovascular:     Rate and Rhythm: Normal rate.  Pulmonary:     Effort: Pulmonary effort is normal.  Abdominal:     Palpations: Abdomen is soft.  Musculoskeletal:     Cervical back: No rigidity.  Skin:    General: Skin is warm and dry.  Neurological:     Mental Status: She is alert and oriented to person, place, and time.  Psychiatric:        Behavior: Behavior normal.     Ortho Exam patient has quite a weakness she is still can do a straight leg raise but can overcome her quad with 2 fingers press at the ankle opposite left leg takes hold hand resistance before I can push her leg down.  She can go up on a step with her left leg first but right leg first has to do a hop and when she is on top of the steps she then extends at the  hips.  Anterior tib is strong no sciatic notch tenderness.  Specialty Comments:  No specialty comments available.  Imaging: No results found.   PMFS History: Patient Active Problem List   Diagnosis Date Noted   Quadriceps weakness 02/26/2021   History of total knee arthroplasty, right 12/14/2020   Other spondylosis with radiculopathy, cervical region 01/13/2019   Chronic neck pain 12/22/2018   History of total knee arthroplasty, left 07/21/2018   Past Medical History:  Diagnosis Date   Arthritis    Asthma    Chronic pain    Contact lens/glasses fitting    wears contacts or glasses   GERD (gastroesophageal reflux disease)    Hypertension    Hypothyroidism    Insomnia    Mini stroke    ~2010   Seasonal allergies    Sleep apnea    CPAP @ night   Wears partial dentures    top and bottom partials    No family history on file.  Past Surgical History:  Procedure Laterality Date   ABDOMINAL HYSTERECTOMY     CHOLECYSTECTOMY     KNEE ARTHROSCOPY  Left 04/09/2007   KNEE ARTHROSCOPY WITH MEDIAL MENISECTOMY Right 07/30/2012   Procedure: KNEE ARTHROSCOPY WITH MEDIAL AND LATERAL MENISECTOMIES, CHONDROPLASTY OF PATELLA;  Surgeon: Ninetta Lights, MD;  Location: [facility];  Service: Orthopedics;  Laterality: Right;   SHOULDER ARTHROSCOPY  09/12/2011   right   TONSILLECTOMY     TOTAL KNEE ARTHROPLASTY Left 05/11/2008   TOTAL KNEE ARTHROPLASTY Right 11/06/2020   Procedure: RIGHT TOTAL KNEE ARTHROPLASTY;  Surgeon: Marybelle Killings, MD;  Location: [place];  Service: Orthopedics;  Laterality: Right;   TOTAL SHOULDER ARTHROPLASTY  07/25/2011   Procedure: TOTAL SHOULDER ARTHROPLASTY;  Surgeon: Ninetta Lights, MD;  Location: [place];  Service: Orthopedics;  Laterality: Left;   Social History   Occupational History   Not on file  Tobacco Use   Smoking status: Never   Smokeless tobacco: Never  Vaping Use   Vaping Use: Never used  Substance and Sexual Activity   Alcohol use: Yes    Comment: occasionally   Drug use: No   Sexual activity: Not on file

## 2022-08-01 ENCOUNTER — Encounter: Payer: Self-pay | Admitting: Neurology

## 2022-09-09 ENCOUNTER — Ambulatory Visit: Payer: 59 | Admitting: Neurology

## 2022-09-09 ENCOUNTER — Encounter: Payer: Self-pay | Admitting: Neurology

## 2022-09-09 VITALS — BP 143/85 | HR 60 | Ht 64.0 in | Wt 199.0 lb

## 2022-09-09 DIAGNOSIS — E236 Other disorders of pituitary gland: Secondary | ICD-10-CM

## 2022-09-09 NOTE — Progress Notes (Signed)
[facility] Neurology Division Clinic Note - Initial Visit   Date: 09/09/2022   Mary Harvey MRN: [id number] DOB: 21-Aug-1955   Dear Edd Fabian, FNP:  Thank you for your kind referral of Mary Harvey for consultation of empty sella. Although her history is well known to you, please allow [place] to reiterate it for the purpose of our medical record. The patient was accompanied to the clinic by self.     Mary Harvey is a 67 y.o. right-handed female with hyperlipidemia, OSA, GERD, asthma, hypertension, hypothyroidism, neuropathy, and BPPV presenting for evaluation of empty sella.   IMPRESSION/PLAN: Empty sella is most like incidental finding.  Her neurological exam is entirely normal.  There is no evidence of papilledema. Her headaches are episodic and mild.  Symptoms are not consistent with increased intracranial pressure. To be complete, I will check MRI brain wo contrast.  She may treat headaches with OTC medications.   ------------------------------------------------------------- History of present illness: In May 2024, she went to [facility] because of left sided neck and headaches.  She recalls having difficulty turning her head.  She also complains of soreness involving the right jaw and sometimes get stuck.  CT head mentioned empty sella, for which she is referred to se me. I do not have imaging to personally view. She reports having headaches about once per month which lasts a few days, which is nagging.  Occasionally, she has nausea.  No photophobia or phonophobia.  She denies blurred vision or double vision.    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  She lives at home with husband.  Nonsmoker.  She does not drink alcohol.   Out-side paper records, electronic medical record, and images have been reviewed where available and summarized as:  CT head 06/25/2022:  No intracranial mass or hemorrhage.  Empty sella.  MRI cervical spine  wo contrast 06/18/2020: 1. 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 There is a degenerative reversal of cervical lordosis with mild C7-T1 anterolisthesis that is progressive. 2. No high-grade foraminal narrowing.  Lab Results  Component Value Date   HGBA1C 5.8 (H) 11/02/2020    Past Medical History:  Diagnosis Date   Arthritis    Asthma    Chronic pain    Contact lens/glasses fitting    wears contacts or glasses   GERD (gastroesophageal reflux disease)    Hypertension    Hypothyroidism    Insomnia    Mini stroke    ~2010   Seasonal allergies    Sleep apnea    CPAP @ night   Wears partial dentures    top and bottom partials    Past Surgical History:  Procedure Laterality Date   ABDOMINAL HYSTERECTOMY     CHOLECYSTECTOMY     KNEE ARTHROSCOPY Left 04/09/2007   KNEE ARTHROSCOPY WITH MEDIAL MENISECTOMY Right 07/30/2012   Procedure: KNEE ARTHROSCOPY WITH MEDIAL AND LATERAL MENISECTOMIES, CHONDROPLASTY OF PATELLA;  Surgeon: Loreta Ave, MD;  Location: [facility];  Service: Orthopedics;  Laterality: Right;   SHOULDER ARTHROSCOPY  09/12/2011   right   TONSILLECTOMY     TOTAL KNEE ARTHROPLASTY Left 05/11/2008   TOTAL KNEE ARTHROPLASTY Right 11/06/2020   Procedure: RIGHT TOTAL KNEE ARTHROPLASTY;  Surgeon: Eldred Manges, MD;  Location: [facility] OR;  Service: Orthopedics;  Laterality: Right;   TOTAL SHOULDER ARTHROPLASTY  07/25/2011   Procedure: TOTAL SHOULDER ARTHROPLASTY;  Surgeon: Loreta Ave,  MD;  Location: [facility];  Service: Orthopedics;  Laterality: Left;     Medications:  Outpatient Encounter Medications as of 09/09/2022  Medication Sig   albuterol (VENTOLIN HFA) 108 (90 Base) MCG/ACT inhaler Inhale 2 puffs into the lungs every 6 (six) hours as needed for wheezing or shortness of breath.   atenolol-chlorthalidone (TENORETIC) 50-25 MG tablet    CALCIUM PO Take 1 tablet by mouth daily.    cetirizine (ZYRTEC) 10 MG tablet Take 10 mg by mouth daily as needed for allergies.   Dulaglutide 0.75 MG/0.5ML SOPN Inject 0.75 mg into the skin once a week.   MAGNESIUM PO Take 2 tablets by mouth daily.   Multiple Vitamins-Minerals (HAIR SKIN AND NAILS FORMULA) TABS Take 1 tablet by mouth daily.   Multiple Vitamins-Minerals (MULTIVITAMIN WITH MINERALS) tablet Take 2 tablets by mouth daily.   potassium chloride SA (KLOR-CON) 20 MEQ tablet Take 1 tablet (20 mEq total) by mouth 2 (two) times daily.   pregabalin (LYRICA) 50 MG capsule Take 1 capsule twice a day by oral route for 30 days.   XIIDRA 5 % SOLN Place 1 drop into both eyes daily as needed (dry eyes).   aspirin EC 325 MG tablet Take 1 tablet (325 mg total) by mouth daily. Must take at least 4 weeks postop DVT prophylaxis (Patient not taking: Reported on 05/03/2022)   Boswellia-Glucosamine-Vit D (OSTEO BI-FLEX ONE PER DAY PO) Take 1 tablet by mouth daily. (Patient not taking: Reported on 09/09/2022)   gabapentin (NEURONTIN) 100 MG capsule Take 100 mg by mouth at bedtime. (Patient not taking: Reported on 05/03/2022)   losartan-hydrochlorothiazide (HYZAAR) 100-25 MG tablet Take 1 tablet by mouth daily. (Patient not taking: Reported on 09/09/2022)   methocarbamol (ROBAXIN) 500 MG tablet Take 1 tablet (500 mg total) by mouth every 6 (six) hours as needed for muscle spasms. (Patient not taking: Reported on 05/03/2022)   oxyCODONE-acetaminophen (PERCOCET) 7.5-325 MG tablet Take 1-2 tablets by mouth every 4 (four) hours as needed for severe pain. (Patient not taking: Reported on 05/03/2022)   No facility-administered encounter medications on file as of 09/09/2022.    Allergies:  Allergies  Allergen Reactions   Mobic [Meloxicam] Shortness Of Breath   Metformin Hcl     Constipation     Family History: Family History  Problem Relation Age of Onset   Epilepsy Mother    Hypertension Father    Heart Problems Father     Social History: Social  History   Tobacco Use   Smoking status: Never   Smokeless tobacco: Never  Vaping Use   Vaping status: Never Used  Substance Use Topics   Alcohol use: Yes    Comment: occasionally   Drug use: No   Social History   Social History Narrative   Are you right handed or left handed? Right Handed    Are you currently employed ? Yes   What is your current occupation? [employer]    Do you live at home alone? No    Who lives with you? Lives with husband    What type of home do you live in: 1 story or 2 story? Two story home        Vital Signs:  BP (!) 143/85   Pulse 60   Ht 5\' 4"  (1.626 m)   Wt 199 lb (90.3 kg)   SpO2 95%   BMI 34.16 kg/m    Neurological Exam: MENTAL STATUS including orientation to time, place, person, recent and  remote memory, attention span and concentration, language, and fund of knowledge is normal.  Speech is not dysarthric.  CRANIAL NERVES: II:  No visual field defects.    No papilledema on fundoscopic exam.  III-IV-VI: Pupils equal round and reactive to light.  Normal conjugate, extra-ocular eye movements in all directions of gaze.  No nystagmus.  No ptosis.   V:  Normal facial sensation.    VII:  Normal facial symmetry and movements.   VIII:  Normal hearing and vestibular function.   IX-X:  Normal palatal movement.   XI:  Normal shoulder shrug and head rotation.   XII:  Normal tongue strength and range of motion, no deviation or fasciculation.  MOTOR:  Motor strength is 5/5 throughout. No atrophy, fasciculations or abnormal movements.  No pronator drift.  MSRs:                                           Right        Left brachioradialis 2+  2+  biceps 2+  2+  triceps 2+  2+  patellar 2+  2+  ankle jerk 2+  2+  Hoffman no  no  plantar response down  down   SENSORY:  Normal and symmetric perception of light touch, pinprick, vibration, and proprioception.    COORDINATION/GAIT: Normal finger-to- nose-finger.  Intact rapid alternating movements  bilaterally.  Able to rise from a chair without using arms.  Gait narrow based and stable. Unassisted.    Thank you for allowing me to participate in patient's care.  If I can answer any additional questions, I would be pleased to do so.    Sincerely,    Marci Polito K. Allena Katz, DO

## 2022-09-09 NOTE — Patient Instructions (Addendum)
[facility] will contact you to schedule MRI brain

## 2022-09-29 ENCOUNTER — Ambulatory Visit
Admission: RE | Admit: 2022-09-29 | Discharge: 2022-09-29 | Disposition: A | Discharge status: Home / Self Care | Payer: 59 | Source: Ambulatory Visit | Attending: Neurology | Admitting: Neurology

## 2022-09-29 DIAGNOSIS — E236 Other disorders of pituitary gland: Secondary | ICD-10-CM

## 2022-10-15 ENCOUNTER — Telehealth: Payer: Self-pay | Admitting: Neurology

## 2022-10-15 DIAGNOSIS — E236 Other disorders of pituitary gland: Secondary | ICD-10-CM

## 2022-10-15 NOTE — Telephone Encounter (Signed)
Please let pt know that MRI continues to show empty sella which was seen on CT scan.  There were also some changes of fluid around the back of the eye.  The best way to determine if there is any raised pressure is with a spinal tap to measure the pressure.  If she is agreeable, please order LP to check opening and closing pressure.  CSF protein, glucose,and cell count.

## 2022-10-15 NOTE — Telephone Encounter (Signed)
Pt is calling in stating that she would like to have her results from her MRI on 09/29/2022.  Pt would like to have a call back.

## 2022-10-16 NOTE — Addendum Note (Signed)
Addended by: Karl Luke A on: 10/16/2022 12:12 PM   Modules accepted: Orders

## 2022-10-16 NOTE — Telephone Encounter (Signed)
Called patient and informed her of MRI results and recommendations per Dr. Allena Katz. Patient would like to proceed with Lumbar puncture and is aware someone will give her a call to get her scheduled. Patient verbalized understanding of all explained to her and had no further questions or concerns.

## 2022-10-31 ENCOUNTER — Telehealth: Payer: Self-pay

## 2022-10-31 NOTE — Telephone Encounter (Signed)
Received a message on Epic from [employer] informing me of below:  Just wanted to let you know Mary Harvey canceled her appt for her lumbar puncture that was scheduled for tomorrow. She has not rescheduled at this time

## 2022-11-01 ENCOUNTER — Inpatient Hospital Stay: Admission: RE | Admit: 2022-11-01 | Payer: 59 | Source: Ambulatory Visit

## 2023-01-12 DEATH — deceased
